# Patient Record
Sex: Female | Born: 1975 | Race: Black or African American | Hispanic: No | Marital: Single | Smoking: Never smoker
Health system: Southern US, Community
[De-identification: ages and names within clinical notes are randomized; demographics above are authoritative.]

---

## 2001-05-05 ENCOUNTER — Emergency Department (HOSPITAL_COMMUNITY): Admission: EM | Admit: 2001-05-05 | Discharge: 2001-05-05 | Payer: Self-pay | Admitting: Emergency Medicine

## 2009-06-13 ENCOUNTER — Inpatient Hospital Stay (HOSPITAL_COMMUNITY): Admission: AD | Admit: 2009-06-13 | Discharge: 2009-06-13 | Payer: Self-pay | Admitting: Obstetrics and Gynecology

## 2009-07-04 ENCOUNTER — Emergency Department (HOSPITAL_COMMUNITY): Admission: EM | Admit: 2009-07-04 | Discharge: 2009-07-04 | Payer: Self-pay | Admitting: Emergency Medicine

## 2010-07-20 ENCOUNTER — Emergency Department (HOSPITAL_COMMUNITY): Admission: EM | Admit: 2010-07-20 | Discharge: 2010-07-21 | Payer: Self-pay | Admitting: Emergency Medicine

## 2011-03-17 LAB — BUN: BUN: 2 mg/dL — ABNORMAL LOW (ref 6–23)

## 2011-03-17 LAB — CREATININE, SERUM
Creatinine, Ser: 0.54 mg/dL (ref 0.4–1.2)
GFR calc Af Amer: >60 mL/min (ref >60)
GFR calc non Af Amer: >60 mL/min (ref >60)

## 2011-03-17 LAB — AST: AST: 18 U/L (ref 0–37)

## 2011-03-17 LAB — CBC
HCT: 35.2 % — ABNORMAL LOW (ref 36.0–46.0)
Hemoglobin: 12.1 g/dL (ref 12.0–15.0)
MCHC: 34.5 g/dL (ref 30.0–36.0)
MCV: 96.3 fL (ref 78.0–100.0)
Platelets: 241 10*3/uL (ref 150–400)
RDW: 14.4 % (ref 11.5–15.5)

## 2011-07-16 ENCOUNTER — Inpatient Hospital Stay (INDEPENDENT_AMBULATORY_CARE_PROVIDER_SITE_OTHER)
Admission: RE | Admit: 2011-07-16 | Discharge: 2011-07-16 | Disposition: A | Discharge status: Home / Self Care | Payer: 59 | Source: Ambulatory Visit | Attending: Family Medicine | Admitting: Family Medicine

## 2011-07-16 DIAGNOSIS — N390 Urinary tract infection, site not specified: Secondary | ICD-10-CM

## 2011-07-16 LAB — POCT URINALYSIS DIP (DEVICE)
Bilirubin Urine: NEGATIVE
Hgb urine dipstick: NEGATIVE
Leukocytes, UA: NEGATIVE
Nitrite: POSITIVE — AB
Specific Gravity, Urine: >=1.03 (ref 1.005–1.030)
Urobilinogen, UA: 0.2 mg/dL (ref 0.0–1.0)

## 2011-07-18 LAB — URINE CULTURE: Culture  Setup Time: 201208072208

## 2011-08-29 ENCOUNTER — Emergency Department (HOSPITAL_COMMUNITY)
Admission: EM | Admit: 2011-08-29 | Discharge: 2011-08-29 | Disposition: A | Discharge status: Home / Self Care | Payer: 59 | Attending: Emergency Medicine | Admitting: Emergency Medicine

## 2011-08-29 DIAGNOSIS — R0602 Shortness of breath: Secondary | ICD-10-CM | POA: Insufficient documentation

## 2011-08-29 DIAGNOSIS — J45909 Unspecified asthma, uncomplicated: Secondary | ICD-10-CM | POA: Insufficient documentation

## 2011-08-29 DIAGNOSIS — R0609 Other forms of dyspnea: Secondary | ICD-10-CM | POA: Insufficient documentation

## 2011-08-29 DIAGNOSIS — R059 Cough, unspecified: Secondary | ICD-10-CM | POA: Insufficient documentation

## 2011-08-29 DIAGNOSIS — R0989 Other specified symptoms and signs involving the circulatory and respiratory systems: Secondary | ICD-10-CM | POA: Insufficient documentation

## 2011-08-29 DIAGNOSIS — R05 Cough: Secondary | ICD-10-CM | POA: Insufficient documentation

## 2012-03-24 ENCOUNTER — Encounter (HOSPITAL_COMMUNITY): Payer: Self-pay | Admitting: Emergency Medicine

## 2012-03-24 ENCOUNTER — Emergency Department (INDEPENDENT_AMBULATORY_CARE_PROVIDER_SITE_OTHER)
Admission: EM | Admit: 2012-03-24 | Discharge: 2012-03-24 | Disposition: A | Discharge status: Home / Self Care | Payer: 59 | Source: Home / Self Care

## 2012-03-24 DIAGNOSIS — J019 Acute sinusitis, unspecified: Secondary | ICD-10-CM

## 2012-03-24 DIAGNOSIS — J209 Acute bronchitis, unspecified: Secondary | ICD-10-CM

## 2012-03-24 MED ORDER — GUAIFENESIN-CODEINE 100-10 MG/5ML PO SYRP: ORAL_SOLUTION | ORAL | Status: AC

## 2012-03-24 MED ORDER — FLUCONAZOLE 150 MG PO TABS: 150.0000 mg | ORAL_TABLET | Freq: Once | ORAL | Status: AC

## 2012-03-24 MED ORDER — AMOXICILLIN-POT CLAVULANATE 875-125 MG PO TABS: 1.0000 | ORAL_TABLET | Freq: Two times a day (BID) | ORAL | Status: AC

## 2012-03-24 NOTE — ED Provider Notes (Signed)
History     CSN: [id number]  Arrival date & time 03/24/12  1943   None     Chief Complaint  Patient presents with  . Nasal Congestion    (Consider location/radiation/quality/duration/timing/severity/associated sxs/prior treatment) HPI Comments: Patient presents today with onset of nasal congestion, productive cough, sinus pressure and headache 3 days ago. She states her symptoms are progressively worsening. She does get mild improvement with Claritin and Nasonex. She has comfort in her left ear and a sore throat also. No fever or chills. Her mucous is yellow in color. Patient is also concerned because she is leaving to the [place] on vacation in 5 days. She has a history of asthma, but denies dyspnea or wheezing. She is a nonsmoker.   Past Medical History  Diagnosis Date  . Asthma     History reviewed. No pertinent past surgical history.  No family history on file.  History  Substance Use Topics  . Smoking status: Not on file  . Smokeless tobacco: Not on file  . Alcohol Use: 0.6 oz/week    1 Glasses of wine per week     occassional    OB History    Grav Para Term Preterm Abortions TAB SAB Ect Mult Living                  Review of Systems  Constitutional: Negative for fever, chills and fatigue.  HENT: Positive for ear pain, congestion, sore throat, rhinorrhea and sinus pressure. Negative for sneezing.   Respiratory: Positive for cough. Negative for shortness of breath and wheezing.   Cardiovascular: Negative for chest pain.  Neurological: Positive for headaches.    Allergies  Review of patient's allergies indicates no known allergies.  Home Medications   Current Outpatient Rx  Name Route Sig Dispense Refill  . AMOXICILLIN-POT CLAVULANATE 875-125 MG PO TABS Oral Take 1 tablet by mouth 2 (two) times daily after a meal. 20 tablet 0  . FLUCONAZOLE 150 MG PO TABS Oral Take 1 tablet (150 mg total) by mouth once. 1 tablet 0  . GUAIFENESIN-CODEINE 100-10 MG/5ML PO  SYRP  1-2 tsp every 6 hrs prn cough 120 mL 0    BP 119/83  Pulse 90  Temp(Src) 97.9 F (36.6 C) (Oral)  Resp 16  SpO2 97%  Physical Exam  Nursing note and vitals reviewed. Constitutional: She appears well-developed and well-nourished. No distress.  HENT:  Head: Normocephalic and atraumatic.  Right Ear: Tympanic membrane, external ear and ear canal normal.  Left Ear: Tympanic membrane, external ear and ear canal normal.  Nose: Mucosal edema present. Right sinus exhibits maxillary sinus tenderness and frontal sinus tenderness. Left sinus exhibits maxillary sinus tenderness and frontal sinus tenderness.  Mouth/Throat: Uvula is midline, oropharynx is clear and moist and mucous membranes are normal. No oropharyngeal exudate, posterior oropharyngeal edema or posterior oropharyngeal erythema.  Neck: Neck supple.  Cardiovascular: Normal rate, regular rhythm and normal heart sounds.   Pulmonary/Chest: Effort normal and breath sounds normal. No respiratory distress.  Lymphadenopathy:    She has no cervical adenopathy.  Neurological: She is alert.  Skin: Skin is warm and dry.  Psychiatric: She has a normal mood and affect.    ED Course  Procedures (including critical care time)  Labs Reviewed - No data to display No results found.   1. Acute sinusitis   2. Acute bronchitis       MDM          Melody Comas, [place] 03/24/12 2038

## 2012-03-24 NOTE — Discharge Instructions (Signed)
Increase fluids and rest. Tylenol or Ibuprofen as needed for discomfort. Do not take the prescription cough medication and drive or operate machinery.  Return if symptoms change or worsen.   Sinusitis Sinusitis an infection of the air pockets (sinuses) in your face. This can cause puffiness (swelling). It can also cause drainage from your sinuses.  HOME CARE   Only take medicine as told by your doctor.   Drink enough fluids to keep your pee (urine) clear or pale yellow.   Apply moist heat or ice packs for pain relief.   Use salt (saline) nose sprays. The spray will wet the thick fluid in the nose. This can help the sinuses drain.  GET HELP RIGHT AWAY IF:   You have a fever.   Your baby is older than 3 months with a rectal temperature of 102 F (38.9 C) or higher.   Your baby is 41 months old or younger with a rectal temperature of 100.4 F (38 C) or higher.   The pain gets worse.   You get a very bad headache.   You keep throwing up (vomiting).   Your face gets puffy.  MAKE SURE YOU:   Understand these instructions.   Will watch your condition.   Will get help right away if you are not doing well or get worse.  Document Released: 05/13/2008 Document Revised: 11/14/2011 Document Reviewed: 05/13/2008 [facility] Patient Information 2012 [place], [place].  Acute Bronchitis Bronchitis is when the organs and tissues involved in breathing get puffy (swollen) and can leak fluid. This makes it harder for air to get in and out of the lungs. You may cough a lot and produce thick spit (mucus). Acute means the illness started suddenly. HOME CARE  Rest.   Drink enough fluids to keep the pee (urine) clear or pale yellow.   Medicines may be given that will open up your airways to help you breathe better. Only take medicine as told by your doctor.   Use a cool mist vaporizer. This will help to thin any thick spit.   Do not smoke. Avoid secondhand smoke.  GET HELP RIGHT AWAY IF:    You have a temperature by mouth above 102 F (38.9 C), not controlled by medicine.   You have chills.   You develop severe shortness of breath or chest pain.   You have bloody spit mixed with mucus (sputum).   You throw up (vomit) often.   You lose too much body fluid (dehydrated).   You have a severe headache.   You feel faint.   You do not improve after 1 week of treatment.  MAKE SURE YOU:   Understand these instructions.   Will watch your condition.   Will get help right away if you are not doing well or get worse.  Document Released: 05/13/2008 Document Revised: 11/14/2011 Document Reviewed: 12/13/2009 [facility] Patient Information 2012 [place], [place].

## 2012-03-24 NOTE — ED Notes (Signed)
Pt. Stated, i started getting sick on Sat with congestion, ear pain, sore throat, and a lot of head pain.

## 2012-03-30 NOTE — ED Provider Notes (Signed)
Medical screening examination/treatment/procedure(s) were performed by non-physician practitioner and as supervising physician I was immediately available for consultation/collaboration.  Luiz Blare MD   Luiz Blare, MD 03/30/12 1420

## 2012-06-08 ENCOUNTER — Encounter (HOSPITAL_COMMUNITY): Payer: Self-pay

## 2012-06-08 ENCOUNTER — Emergency Department (HOSPITAL_COMMUNITY)
Admission: EM | Admit: 2012-06-08 | Discharge: 2012-06-08 | Disposition: A | Discharge status: Home / Self Care | Payer: 59 | Source: Home / Self Care | Attending: Family Medicine | Admitting: Family Medicine

## 2012-06-08 DIAGNOSIS — R229 Localized swelling, mass and lump, unspecified: Secondary | ICD-10-CM

## 2012-06-08 MED ORDER — IBUPROFEN 600 MG PO TABS: 600.0000 mg | ORAL_TABLET | Freq: Three times a day (TID) | ORAL | Status: AC | PRN

## 2012-06-08 NOTE — ED Notes (Signed)
States she woke this AM with a lump on her left mid tibial area; denies trauma; has used ice; concerned for poss "clot" in her leg

## 2012-06-08 NOTE — ED Provider Notes (Signed)
History     CSN: [id number]  Arrival date & time 06/08/12  1110   First MD Initiated Contact with Patient 06/08/12 1123      Chief Complaint  Patient presents with  . Skin Problem    (Consider location/radiation/quality/duration/timing/severity/associated sxs/prior treatment) HPI Comments: 36 year old female nondiabetic. No significant past medical history. Here complaining of a focal swelling mildly tender area in the left anterior mid lower extremity. Noticed first some early this morning. Pain does not increase with weightbearing or walking. Only tender to touch. No cough tenderness. Denies any known injury or recent falls. Denies general malaise, fever, chills, cough, congestion, headache, or any other symptom.   Past Medical History  Diagnosis Date  . Asthma     History reviewed. No pertinent past surgical history.  History reviewed. No pertinent family history.  History  Substance Use Topics  . Smoking status: Not on file  . Smokeless tobacco: Not on file  . Alcohol Use: 0.6 oz/week    1 Glasses of wine per week     occassional    OB History    Grav Para Term Preterm Abortions TAB SAB Ect Mult Living                  Review of Systems  Constitutional: Negative for fever, chills, appetite change and fatigue.       10 systems reviewed and  pertinent negative and positive symptoms are as per HPI.     All other systems reviewed and are negative.    Allergies  Review of patient's allergies indicates no known allergies.  Home Medications   Current Outpatient Rx  Name Route Sig Dispense Refill  . DESLORATADINE 5 MG PO TABS Oral Take 5 mg by mouth daily.    . IBUPROFEN 600 MG PO TABS Oral Take 1 tablet (600 mg total) by mouth every 8 (eight) hours as needed for pain. 20 tablet 0    BP 96/67  Pulse 74  Temp 98.6 F (37 C) (Oral)  Resp 18  SpO2 100%  LMP 05/21/2012  Physical Exam  Nursing note and vitals reviewed. Constitutional: She is oriented to  person, place, and time. She appears well-developed and well-nourished. No distress.  HENT:  Head: Normocephalic and atraumatic.  Mouth/Throat: Oropharynx is clear and moist. No oropharyngeal exudate.  Eyes: Conjunctivae are normal. Pupils are equal, round, and reactive to light. No scleral icterus.  Neck: Neck supple. No thyromegaly present.  Cardiovascular: Normal heart sounds.   Pulmonary/Chest: Breath sounds normal.  Abdominal: Soft. There is no tenderness.       No hepatosplenomegally    Lymphadenopathy:    She has no cervical adenopathy.  Neurological: She is alert and oriented to person, place, and time.  Skin:       Left lower leg: diffusse area or minimal tenderness and induration antero medial to mid tibial bone. No nodular. No crepitus. No fluctuation, erythema, bruising, discoloration or hyperpigmentation. No associated skin brakes, pustules, abrassion or laceration. Normal temperature compared with right side.  Intact sensation and distal pedal pulses are normal.     ED Course  Procedures (including critical care time)  Labs Reviewed - No data to display No results found.   1. Localized skin mass, lump, or swelling       MDM  Impress focal contusion of the left medial tibia. Likely from unknown trauma. Recommended to use ice pad and take ibuprofen. Asked to return to medical attention if worsening or new symptoms.  Sharin Grave, MD 06/09/12 [phone]

## 2012-06-08 NOTE — Discharge Instructions (Signed)
Take the prescribed medication as instructed and recommended to take ibuprofen with food as it can upset your stomach. Use ice pad as much as possible today or at least 15 minutes 3 times a day. Return to medical attention if new symptoms like general malaise fever or worsening spreading rash, swelling, drainge or increased pain. Otherwise followup with your primary care provider if persistent symptoms after 1 week.

## 2013-12-29 ENCOUNTER — Emergency Department (HOSPITAL_COMMUNITY)
Admission: EM | Admit: 2013-12-29 | Discharge: 2013-12-30 | Disposition: A | Discharge status: Home / Self Care | Payer: 59 | Attending: Emergency Medicine | Admitting: Emergency Medicine

## 2013-12-29 DIAGNOSIS — Z3202 Encounter for pregnancy test, result negative: Secondary | ICD-10-CM | POA: Insufficient documentation

## 2013-12-29 DIAGNOSIS — R091 Pleurisy: Secondary | ICD-10-CM

## 2013-12-29 DIAGNOSIS — R071 Chest pain on breathing: Secondary | ICD-10-CM | POA: Insufficient documentation

## 2013-12-29 DIAGNOSIS — N390 Urinary tract infection, site not specified: Secondary | ICD-10-CM

## 2013-12-29 DIAGNOSIS — J45909 Unspecified asthma, uncomplicated: Secondary | ICD-10-CM | POA: Insufficient documentation

## 2013-12-30 ENCOUNTER — Emergency Department (HOSPITAL_COMMUNITY): Payer: 59

## 2013-12-30 ENCOUNTER — Encounter (HOSPITAL_COMMUNITY): Payer: Self-pay | Admitting: Emergency Medicine

## 2013-12-30 LAB — BASIC METABOLIC PANEL
BUN: 5 mg/dL — ABNORMAL LOW (ref 6–23)
CALCIUM: 8.6 mg/dL (ref 8.4–10.5)
CHLORIDE: 104 meq/L (ref 96–112)
CO2: 22 meq/L (ref 19–32)
Creatinine, Ser: 0.64 mg/dL (ref 0.50–1.10)
GFR calc non Af Amer: >90 mL/min (ref >90)
GLUCOSE: 92 mg/dL (ref 70–99)
POTASSIUM: 3.6 meq/L — AB (ref 3.7–5.3)
SODIUM: 139 meq/L (ref 137–147)

## 2013-12-30 LAB — URINALYSIS, ROUTINE W REFLEX MICROSCOPIC
Bilirubin Urine: NEGATIVE
Glucose, UA: NEGATIVE mg/dL
Hgb urine dipstick: NEGATIVE
Ketones, ur: NEGATIVE mg/dL
NITRITE: POSITIVE — AB
PH: 5.5 (ref 5.0–8.0)
Protein, ur: NEGATIVE mg/dL
Specific Gravity, Urine: 1.021 (ref 1.005–1.030)
Urobilinogen, UA: 0.2 mg/dL (ref 0.0–1.0)

## 2013-12-30 LAB — CBC
HEMATOCRIT: 35.4 % — AB (ref 36.0–46.0)
HEMOGLOBIN: 12.5 g/dL (ref 12.0–15.0)
MCH: 31.8 pg (ref 26.0–34.0)
MCHC: 35.3 g/dL (ref 30.0–36.0)
MCV: 90.1 fL (ref 78.0–100.0)
PLATELETS: 259 10*3/uL (ref 150–400)
RBC: 3.93 MIL/uL (ref 3.87–5.11)
RDW: 13.6 % (ref 11.5–15.5)
WBC: 4.8 10*3/uL (ref 4.0–10.5)

## 2013-12-30 LAB — URINE MICROSCOPIC-ADD ON

## 2013-12-30 LAB — POCT I-STAT TROPONIN I: Troponin i, poc: 0 ng/mL (ref 0.00–0.08)

## 2013-12-30 LAB — POCT PREGNANCY, URINE: Preg Test, Ur: NEGATIVE

## 2013-12-30 LAB — D-DIMER, QUANTITATIVE: D-Dimer, Quant: <0.27 ug/mL-FEU (ref 0.00–0.48)

## 2013-12-30 MED ORDER — IBUPROFEN 600 MG PO TABS: 600.0000 mg | ORAL_TABLET | Freq: Four times a day (QID) | ORAL | Status: DC | PRN

## 2013-12-30 MED ORDER — KETOROLAC TROMETHAMINE 30 MG/ML IJ SOLN
30.0000 mg | Freq: Once | INTRAMUSCULAR | Status: AC
  Administered 2013-12-30: 30 mg via INTRAVENOUS
  Filled 2013-12-30: qty 1

## 2013-12-30 MED ORDER — NITROFURANTOIN MONOHYD MACRO 100 MG PO CAPS: 100.0000 mg | ORAL_CAPSULE | Freq: Two times a day (BID) | ORAL | Status: DC

## 2013-12-30 NOTE — Discharge Instructions (Signed)
Pleurisy Pleurisy is an inflammation and swelling of the lining of the lungs (pleura). Because of this inflammation, it hurts to breathe. It can be aggravated by coughing, laughing, or deep breathing. Pleurisy is often caused by an underlying infection or disease.  HOME CARE INSTRUCTIONS  Monitor your pleurisy for any changes. The following actions may help to alleviate any discomfort you are experiencing:  Medicine may help with pain. Only take over-the-counter or prescription medicines for pain, discomfort, or fever as directed by your health care provider.  Only take antibiotic medicine as directed. Make sure to finish it even if you start to feel better. SEEK MEDICAL CARE IF:   Your pain is not controlled with medicine or is increasing.  You have an increase in pus-like (purulent) secretions brought up with coughing. SEEK IMMEDIATE MEDICAL CARE IF:   You have blue or dark lips, fingernails, or toenails.  You are coughing up blood.  You have increased difficulty breathing.  You have continuing pain unrelieved by medicine or pain lasting more than 1 week.  You have pain that radiates into your neck, arms, or jaw.  You develop increased shortness of breath or wheezing.  You develop a fever, rash, vomiting, fainting, or other serious symptoms. MAKE SURE YOU:  Understand these instructions.   Will watch your condition.   Will get help right away if you are not doing well or get worse.  Document Released: 11/25/2005 Document Revised: 07/28/2013 Document Reviewed: 05/09/2013 [facility] Patient Information 2014 [place].  Urinary Tract Infection Urinary tract infections (UTIs) can develop anywhere along your urinary tract. Your urinary tract is your body's drainage system for removing wastes and extra water. Your urinary tract includes two kidneys, two ureters, a bladder, and a urethra. Your kidneys are a pair of bean-shaped organs. Each kidney is about the size of your  fist. They are located below your ribs, one on each side of your spine. CAUSES Infections are caused by microbes, which are microscopic organisms, including fungi, viruses, and bacteria. These organisms are so small that they can only be seen through a microscope. Bacteria are the microbes that most commonly cause UTIs. SYMPTOMS  Symptoms of UTIs may vary by age and gender of the patient and by the location of the infection. Symptoms in young women typically include a frequent and intense urge to urinate and a painful, burning feeling in the bladder or urethra during urination. Older women and men are more likely to be tired, shaky, and weak and have muscle aches and abdominal pain. A fever may mean the infection is in your kidneys. Other symptoms of a kidney infection include pain in your back or sides below the ribs, nausea, and vomiting. DIAGNOSIS To diagnose a UTI, your caregiver will ask you about your symptoms. Your caregiver also will ask to provide a urine sample. The urine sample will be tested for bacteria and white blood cells. White blood cells are made by your body to help fight infection. TREATMENT  Typically, UTIs can be treated with medication. Because most UTIs are caused by a bacterial infection, they usually can be treated with the use of antibiotics. The choice of antibiotic and length of treatment depend on your symptoms and the type of bacteria causing your infection. HOME CARE INSTRUCTIONS  If you were prescribed antibiotics, take them exactly as your caregiver instructs you. Finish the medication even if you feel better after you have only taken some of the medication.  Drink enough water and fluids to  keep your urine clear or pale yellow.  Avoid caffeine, tea, and carbonated beverages. They tend to irritate your bladder.  Empty your bladder often. Avoid holding urine for long periods of time.  Empty your bladder before and after sexual intercourse.  After a bowel  movement, women should cleanse from front to back. Use each tissue only once. SEEK MEDICAL CARE IF:   You have back pain.  You develop a fever.  Your symptoms do not begin to resolve within 3 days. SEEK IMMEDIATE MEDICAL CARE IF:   You have severe back pain or lower abdominal pain.  You develop chills.  You have nausea or vomiting.  You have continued burning or discomfort with urination. MAKE SURE YOU:   Understand these instructions.  Will watch your condition.  Will get help right away if you are not doing well or get worse. Document Released: 09/04/2005 Document Revised: 05/26/2012 Document Reviewed: 01/03/2012 [facility] Patient Information 2014 [place].

## 2013-12-30 NOTE — ED Notes (Signed)
Pt states that she began to have back pain yesterday and that it is now radiating through to left chest under left breast; pt states that the pain is sharp and worse upon taking a deep breathe; pt denies shortness of breath or nausea; pt states "the pain feels like it is on the inside"

## 2013-12-30 NOTE — ED Provider Notes (Signed)
CSN: [id number]     Arrival date & time 12/29/13  2352 History   First MD Initiated Contact with Patient 12/30/13 0102     Chief Complaint  Patient presents with  . Chest Pain   (Consider location/radiation/quality/duration/timing/severity/associated sxs/prior Treatment) HPI Patient states she woke 2 days ago with left thoracic back pain. The pain was worse with movement and deep breathing. She denies any trauma or heavy lifting. She's had no cough, fever or chills. The pain began to radiate around to the left chest area under her breasts. The pain is sharp and continues to be worse with deep breathing. She's had no lower extremity pain or swelling. She has had no recent extended travel or surgeries. She is not on oral birth control. She states her mother did have a DVT in conjunction with pregnancy requiring anticoagulants. Past Medical History  Diagnosis Date  . Asthma    History reviewed. No pertinent past surgical history. No family history on file. History  Substance Use Topics  . Smoking status: Never Smoker   . Smokeless tobacco: Not on file  . Alcohol Use: 0.6 oz/week    1 Glasses of wine per week     Comment: occassional   OB History   Grav Para Term Preterm Abortions TAB SAB Ect Mult Living                 Review of Systems  Constitutional: Negative for fever and chills.  Respiratory: Negative for cough and shortness of breath.   Cardiovascular: Positive for chest pain. Negative for palpitations and leg swelling.  Gastrointestinal: Negative for nausea, vomiting, abdominal pain, diarrhea, constipation and abdominal distention.  Genitourinary: Negative for dysuria, frequency and flank pain.  Musculoskeletal: Negative for back pain, neck pain and neck stiffness.  Skin: Negative for rash and wound.  Neurological: Negative for dizziness, speech difficulty, weakness, light-headedness, numbness and headaches.  All other systems reviewed and are negative.    Allergies   Review of patient's allergies indicates no known allergies.  Home Medications   Current Outpatient Rx  Name  Route  Sig  Dispense  Refill  . Cranberry-Vitamin C-Probiotic (AZO CRANBERRY PO)   Oral   Take 2 tablets by mouth once.          BP 122/82  Pulse 85  Temp(Src) 97.9 F (36.6 C) (Oral)  Resp 20  SpO2 98%  LMP 12/20/2013 Physical Exam  Nursing note and vitals reviewed. Constitutional: She is oriented to person, place, and time. She appears well-developed and well-nourished. No distress.  HENT:  Head: Normocephalic and atraumatic.  Mouth/Throat: Oropharynx is clear and moist.  Eyes: EOM are normal. Pupils are equal, round, and reactive to light.  Neck: Normal range of motion. Neck supple.  Cardiovascular: Normal rate and regular rhythm.   Pulmonary/Chest: Effort normal and breath sounds normal. No respiratory distress. She has no wheezes. She has no rales. She exhibits no tenderness.  Abdominal: Soft. Bowel sounds are normal. She exhibits no distension and no mass. There is no tenderness. There is no rebound and no guarding.  Musculoskeletal: Normal range of motion. She exhibits no edema and no tenderness.  No thoracic tenderness to palpation. No calf swelling or tenderness.  Neurological: She is alert and oriented to person, place, and time.  Patient is alert and oriented x3 with clear, goal oriented speech. Patient has 5/5 motor in all extremities. Sensation is intact to light touch.   Skin: Skin is warm and dry. No rash noted. No  erythema.  Psychiatric: She has a normal mood and affect. Her behavior is normal.    ED Course  Procedures (including critical care time) Labs Review Labs Reviewed  CBC - Abnormal; Notable for the following:    HCT 35.4 (*)    All other components within normal limits  BASIC METABOLIC PANEL - Abnormal; Notable for the following:    Potassium 3.6 (*)    BUN 5 (*)    All other components within normal limits  URINALYSIS, ROUTINE W  REFLEX MICROSCOPIC - Abnormal; Notable for the following:    Color, Urine AMBER (*)    APPearance CLOUDY (*)    Nitrite POSITIVE (*)    Leukocytes, UA SMALL (*)    All other components within normal limits  URINE MICROSCOPIC-ADD ON - Abnormal; Notable for the following:    Squamous Epithelial / LPF MANY (*)    Bacteria, UA MANY (*)    All other components within normal limits  URINE CULTURE  D-DIMER, QUANTITATIVE  POCT I-STAT TROPONIN I  POCT PREGNANCY, URINE   Imaging Review Dg Chest Port 1 View  12/30/2013   CLINICAL DATA:  Chest pain  EXAM: PORTABLE CHEST - 1 VIEW  COMPARISON:  None.  FINDINGS: Numerous leads and wires project over the chest. Patient rotated left. Midline trachea. Normal heart size and mediastinal contours. No pleural effusion or pneumothorax. Clear lungs.  IMPRESSION: No acute cardiopulmonary disease.   Electronically Signed   By: Abigail Miyamoto M.D.   On: 12/30/2013 01:04    EKG Interpretation    Date/Time:  Thursday December 30 2013 00:01:31 EST Ventricular Rate:  79 PR Interval:  145 QRS Duration: 89 QT Interval:  394 QTC Calculation: 452 R Axis:   84 Text Interpretation:  Sinus rhythm Borderline T abnormalities, anterior leads Baseline wander in lead(s) V4 Confirmed by Lita Mains  MD, Shian Goodnow ([id number]) on 12/30/2013 2:33:03 AM            MDM    Patient says she's feeling much better after Toradol. Her d-dimer is normal. Vital signs remained stable in the emergency department. We'll treat her urinary tract infection. Patient likely has pleurisy as cause for her chest pain. Doubt coronary artery disease given negative troponin and EKG and no risk factors. Return precautions have been given and patient voiced understanding.  Julianne Rice, MD 12/30/13 [phone]

## 2014-01-01 LAB — URINE CULTURE: Colony Count: >=100000

## 2014-01-02 ENCOUNTER — Telehealth (HOSPITAL_COMMUNITY): Payer: Self-pay | Admitting: Emergency Medicine

## 2014-01-02 NOTE — ED Notes (Signed)
Post ED Visit - Positive Culture Follow-up  Culture report reviewed by antimicrobial stewardship pharmacist: []  Wes Oxford, Pharm.D., BCPS [x]  Heide Guile, Pharm.D., BCPS []  Alycia Rossetti, Pharm.D., BCPS []  [place], Pharm.D., BCPS, AAHIVP []  Legrand Como, Pharm.D., BCPS, AAHIVP  Positive urine culture Treated with Macrobid, organism sensitive to the same and no further patient follow-up is required at this time.  Myrna Blazer 01/02/2014, 11:33 AM

## 2015-01-16 ENCOUNTER — Emergency Department (HOSPITAL_COMMUNITY)
Admission: EM | Admit: 2015-01-16 | Discharge: 2015-01-16 | Disposition: A | Discharge status: Home / Self Care | Payer: Self-pay | Source: Home / Self Care

## 2015-02-08 ENCOUNTER — Encounter: Payer: 59 | Attending: Obstetrics and Gynecology | Admitting: Dietician

## 2015-02-08 VITALS — Ht 62.0 in | Wt 150.2 lb

## 2015-02-08 DIAGNOSIS — R7309 Other abnormal glucose: Secondary | ICD-10-CM | POA: Insufficient documentation

## 2015-02-08 DIAGNOSIS — R7303 Prediabetes: Secondary | ICD-10-CM

## 2015-02-08 DIAGNOSIS — Z713 Dietary counseling and surveillance: Secondary | ICD-10-CM | POA: Diagnosis not present

## 2015-02-08 NOTE — Patient Instructions (Addendum)
-  Add a protein food to each meal and snack  -Boiled egg, nuts, yogurt, [place] bacon/sausage, chicken, cheese   -Work on scaling back on oil when cooking  -Continue exercise routine  -Continue to watch overall portions  -Avoid peanut butter that has "partially hydrogenated oil" in the ingredients (the fewer ingredients, the better)

## 2015-02-08 NOTE — Progress Notes (Signed)
  Medical Nutrition Therapy:  Appt start time: 0810 end time:  0905.   Assessment:  Primary concerns today: Stephanie Ayers is here today referred for prediabetes. She is motivated to prevent the onset of diabetes. HgbA1c 5.8%. 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 Recently bought an elliptical and tries to use it 10-20 minutes per day. Also bought a fitbit, tracks foods and exercise. 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 She lives with her 39 year old daughter. They both do the food shopping and cooking. They eat out about 4 days a week.   She considers herself a picky eater. Doesn't really like vegeatbels besides greens, salad, celery, and cucumbers. However, [name] states she is open to trying new foods. Recently switched from using sugar to sugar substitute. Has been trying to limit portions and drink more water. Tries not to eat after 8pm.   Preferred Learning Style:   No preference indicated   Learning Readiness:   Ready   MEDICATIONS: vitamin D   DIETARY INTAKE:  Avoided foods include most vegetables, eggs, and seafood.    24-hr recall:  B (6-9 AM): 1 waffle with lite syrup and grapefruit  Snk ( AM): sometimes fruit  L ( PM): salad with 2 Tbsp olive garden lite dressing or peanut butter and jelly sandwich Snk ( PM): sometimes fruit or fruit snacks D ( PM): Chickfila kids meal nuggets with diet lemonade Snk ( PM):   Beverages: 40 oz water per day, 0.5-1 cup Trop50 orange juice per day, occasionally 100% apple juice  Usual physical activity: 10-20 minutes on the elliptical per day  Estimated energy needs: 1600-1800 calories 180-200 g carbohydrates 120-135 g protein 44-50 g fat  Progress Towards Goal(s):  In progress.   Nutritional Diagnosis:  [place]-2.2 Altered nutrition-related laboratory As related to history of excessive carbohydrate intake.  As evidenced by HgbA1c 5.8%.    Intervention:  Nutrition counseling  provided. Explained HgbA1c and effects of macronutrients and exercise on blood sugars. Goals: -Add a protein food to each meal and snack  -Boiled egg, nuts, yogurt, [place] bacon/sausage, chicken, cheese  -Work on scaling back on oil when cooking -Continue exercise routine -Continue to watch overall portions -Avoid peanut butter that has "partially hydrogenated oil" in the ingredients (the fewer ingredients, the better)  Teaching Method Utilized:  Visual Auditory  Handouts given during visit include:  MyPlate  Snack sheet  Meal planning card  Barriers to learning/adherence to lifestyle change: none  Demonstrated degree of understanding via:  Teach Back   Monitoring/Evaluation:  Dietary intake, exercise, labs, and body weight in 2 month(s).

## 2015-03-29 ENCOUNTER — Ambulatory Visit: Payer: 59 | Admitting: Dietician

## 2017-01-27 DIAGNOSIS — J4520 Mild intermittent asthma, uncomplicated: Secondary | ICD-10-CM | POA: Diagnosis not present

## 2017-01-27 DIAGNOSIS — M3500 Sicca syndrome, unspecified: Secondary | ICD-10-CM | POA: Diagnosis not present

## 2017-01-27 DIAGNOSIS — G43009 Migraine without aura, not intractable, without status migrainosus: Secondary | ICD-10-CM | POA: Diagnosis not present

## 2017-03-14 DIAGNOSIS — M3500 Sicca syndrome, unspecified: Secondary | ICD-10-CM | POA: Diagnosis not present

## 2017-03-27 DIAGNOSIS — G43009 Migraine without aura, not intractable, without status migrainosus: Secondary | ICD-10-CM | POA: Diagnosis not present

## 2017-03-27 DIAGNOSIS — J4520 Mild intermittent asthma, uncomplicated: Secondary | ICD-10-CM | POA: Diagnosis not present

## 2017-03-27 DIAGNOSIS — M3500 Sicca syndrome, unspecified: Secondary | ICD-10-CM | POA: Diagnosis not present

## 2017-09-29 DIAGNOSIS — J4520 Mild intermittent asthma, uncomplicated: Secondary | ICD-10-CM | POA: Diagnosis not present

## 2017-09-29 DIAGNOSIS — Z23 Encounter for immunization: Secondary | ICD-10-CM | POA: Diagnosis not present

## 2017-09-29 DIAGNOSIS — Z13220 Encounter for screening for lipoid disorders: Secondary | ICD-10-CM | POA: Diagnosis not present

## 2017-09-29 DIAGNOSIS — M3500 Sicca syndrome, unspecified: Secondary | ICD-10-CM | POA: Diagnosis not present

## 2017-09-29 DIAGNOSIS — G43009 Migraine without aura, not intractable, without status migrainosus: Secondary | ICD-10-CM | POA: Diagnosis not present

## 2017-09-29 DIAGNOSIS — R030 Elevated blood-pressure reading, without diagnosis of hypertension: Secondary | ICD-10-CM | POA: Diagnosis not present

## 2017-11-25 DIAGNOSIS — J4520 Mild intermittent asthma, uncomplicated: Secondary | ICD-10-CM | POA: Diagnosis not present

## 2017-11-25 DIAGNOSIS — M3500 Sicca syndrome, unspecified: Secondary | ICD-10-CM | POA: Diagnosis not present

## 2017-11-25 DIAGNOSIS — G43009 Migraine without aura, not intractable, without status migrainosus: Secondary | ICD-10-CM | POA: Diagnosis not present

## 2017-11-27 ENCOUNTER — Encounter (HOSPITAL_COMMUNITY): Payer: Self-pay | Admitting: Emergency Medicine

## 2017-11-27 ENCOUNTER — Other Ambulatory Visit: Payer: Self-pay

## 2017-11-27 ENCOUNTER — Ambulatory Visit (HOSPITAL_COMMUNITY)
Admission: EM | Admit: 2017-11-27 | Discharge: 2017-11-27 | Disposition: A | Discharge status: Home / Self Care | Payer: 59 | Attending: Family Medicine | Admitting: Family Medicine

## 2017-11-27 DIAGNOSIS — M25512 Pain in left shoulder: Secondary | ICD-10-CM | POA: Diagnosis not present

## 2017-11-27 NOTE — ED Triage Notes (Signed)
Pt c/o L shoulder pain since yesterday, maybe have tried to dislodge a door with her shoulder which caused the pain. No obvious bruising or deformityl

## 2017-11-27 NOTE — ED Provider Notes (Signed)
[place]    CSN: [id number] Arrival date & time: 11/27/17  1256     History   Chief Complaint Chief Complaint  Patient presents with  . Shoulder Pain    HPI Stephanie Ayers is a 41 y.o. female presenting with acute left shoulder pain for 1 day. She states she was trying to open a door with a broken knob and had to use more force than normal. Started to feel soreness later that evening. No numbness or tingling. Increased pain with movement today. Mild discomfort with neck rotation. No difficulty breathing, no shortness of breath, no chest pain.   HPI  Past Medical History:  Diagnosis Date  . Asthma     There are no active problems to display for this patient.   History reviewed. No pertinent surgical history.  OB History    No data available       Home Medications    Prior to Admission medications   Medication Sig Start Date End Date Taking? Authorizing Provider  cholecalciferol (VITAMIN D) 1000 UNITS tablet Take 1,000 Units by mouth daily.    [provider]  Cranberry-Vitamin C-Probiotic (AZO CRANBERRY PO) Take 2 tablets by mouth once.    [provider]  ibuprofen (ADVIL,MOTRIN) 600 MG tablet Take 1 tablet (600 mg total) by mouth every 6 (six) hours as needed. 12/30/13   Julianne Rice, MD  nitrofurantoin, macrocrystal-monohydrate, (MACROBID) 100 MG capsule Take 1 capsule (100 mg total) by mouth 2 (two) times daily. 12/30/13   Julianne Rice, MD    Family History No family history on file.  Social History Social History   Tobacco Use  . Smoking status: Never Smoker  Substance Use Topics  . Alcohol use: Yes    Alcohol/week: 0.6 oz    Types: 1 Glasses of wine per week    Comment: occassional  . Drug use: No     Allergies   Shellfish allergy   Review of Systems Review of Systems  Musculoskeletal: Positive for arthralgias and neck pain. Negative for joint swelling and neck stiffness.  All other systems reviewed  and are negative.    Physical Exam Triage Vital Signs ED Triage Vitals [11/27/17 1313]  Enc Vitals Group     BP (!) 130/94     Pulse Rate 95     Resp 14     Temp 98.1 F (36.7 C)     Temp src      SpO2 98 %     Weight      Height      Head Circumference      Peak Flow      Pain Score      Pain Loc      Pain Edu?      Excl. in [place]?    No data found.  Updated Vital Signs BP (!) 130/94   Pulse 95   Temp 98.1 F (36.7 C)   Resp 14   SpO2 98%    Physical Exam  Constitutional: She is oriented to person, place, and time. She appears well-developed and well-nourished. No distress.  HENT:  Head: Normocephalic and atraumatic.  Neck: Normal range of motion. Neck supple.  Cardiovascular: Normal rate and regular rhythm.  No murmur heard. Pulmonary/Chest: Effort normal and breath sounds normal. No respiratory distress. She has no wheezes.  Musculoskeletal: She exhibits tenderness.  No swelling or erythema to shoulder. Mild tenderness to palpation along scapular spine and neck musculature.   Full active  ROM with forward flexion, internal rotation, adduction, abduction, external rotation. Painful with internal and external rotation.   Negative Hawkings, Negative neers, Negative AC crossover  Radial pulse 2+, cap refill < 2   Neurological: She is alert and oriented to person, place, and time.     UC Treatments / Results  Labs (all labs ordered are listed, but only abnormal results are displayed) Labs Reviewed - No data to display  EKG  EKG Interpretation None       Radiology No results found.  Procedures Procedures (including critical care time)  Medications Ordered in UC Medications - No data to display   Initial Impression / Assessment and Plan / UC Course  I have reviewed the triage vital signs and the nursing notes.  Pertinent labs & imaging results that were available during my care of the patient were reviewed by me and considered in my medical  decision making (see chart for details).     Likely Muscular strain from door incident. Advised anti-inflammatories with ice/heat. Avoid aggravating motions but continue to use. X ray imaging deferred as fracture/dislocations seem unlikely. Follow up in 2 weeks with orthopedics if symptoms not improving.   Final Clinical Impressions(s) / UC Diagnoses   Final diagnoses:  None    ED Discharge Orders    None       Controlled Substance Prescriptions [place] Controlled Substance Registry consulted? Not Applicable   Janith Lima, [place] 11/27/17 1345

## 2017-11-27 NOTE — Discharge Instructions (Signed)
Pain is most likely a muscular strain. Pain may take 1-2 weeks to resolve.  Use anti-inflammatories for pain/swelling. You may take up to 800 mg Ibuprofen every 8 hours with food. You may supplement Ibuprofen with Tylenol [phone] mg every 8 hours.   Please alternate using ice/heating pad to help with any discomfort.   Please follow up here or with your primary care if symptoms not improving in 2 weeks. Please return sooner if pain changes, worsens, develop numbness, tingling.   Please follow up in 2 weeks with orthopedics if symptoms persist and are not improving.

## 2017-12-05 DIAGNOSIS — Z1231 Encounter for screening mammogram for malignant neoplasm of breast: Secondary | ICD-10-CM | POA: Diagnosis not present

## 2017-12-17 DIAGNOSIS — R922 Inconclusive mammogram: Secondary | ICD-10-CM | POA: Diagnosis not present

## 2017-12-17 DIAGNOSIS — R921 Mammographic calcification found on diagnostic imaging of breast: Secondary | ICD-10-CM | POA: Diagnosis not present

## 2017-12-23 DIAGNOSIS — Z01419 Encounter for gynecological examination (general) (routine) without abnormal findings: Secondary | ICD-10-CM | POA: Diagnosis not present

## 2017-12-24 ENCOUNTER — Other Ambulatory Visit: Payer: Self-pay | Admitting: Radiology

## 2017-12-24 DIAGNOSIS — N62 Hypertrophy of breast: Secondary | ICD-10-CM | POA: Diagnosis not present

## 2017-12-24 DIAGNOSIS — R921 Mammographic calcification found on diagnostic imaging of breast: Secondary | ICD-10-CM | POA: Diagnosis not present

## 2017-12-24 DIAGNOSIS — N6091 Unspecified benign mammary dysplasia of right breast: Secondary | ICD-10-CM | POA: Diagnosis not present

## 2018-01-21 DIAGNOSIS — R87612 Low grade squamous intraepithelial lesion on cytologic smear of cervix (LGSIL): Secondary | ICD-10-CM | POA: Diagnosis not present

## 2018-04-06 DIAGNOSIS — Z0000 Encounter for general adult medical examination without abnormal findings: Secondary | ICD-10-CM | POA: Diagnosis not present

## 2018-04-06 DIAGNOSIS — Z79899 Other long term (current) drug therapy: Secondary | ICD-10-CM | POA: Diagnosis not present

## 2018-04-23 DIAGNOSIS — R921 Mammographic calcification found on diagnostic imaging of breast: Secondary | ICD-10-CM | POA: Diagnosis not present

## 2018-09-17 DIAGNOSIS — J4520 Mild intermittent asthma, uncomplicated: Secondary | ICD-10-CM | POA: Diagnosis not present

## 2018-09-17 DIAGNOSIS — M3500 Sicca syndrome, unspecified: Secondary | ICD-10-CM | POA: Diagnosis not present

## 2018-09-17 DIAGNOSIS — G43009 Migraine without aura, not intractable, without status migrainosus: Secondary | ICD-10-CM | POA: Diagnosis not present

## 2018-12-24 DIAGNOSIS — R921 Mammographic calcification found on diagnostic imaging of breast: Secondary | ICD-10-CM | POA: Diagnosis not present

## 2019-01-07 DIAGNOSIS — Z01419 Encounter for gynecological examination (general) (routine) without abnormal findings: Secondary | ICD-10-CM | POA: Diagnosis not present

## 2019-01-07 DIAGNOSIS — Z1329 Encounter for screening for other suspected endocrine disorder: Secondary | ICD-10-CM | POA: Diagnosis not present

## 2019-01-07 DIAGNOSIS — Z0000 Encounter for general adult medical examination without abnormal findings: Secondary | ICD-10-CM | POA: Diagnosis not present

## 2019-01-07 DIAGNOSIS — Z6828 Body mass index (BMI) 28.0-28.9, adult: Secondary | ICD-10-CM | POA: Diagnosis not present

## 2019-01-07 DIAGNOSIS — R87612 Low grade squamous intraepithelial lesion on cytologic smear of cervix (LGSIL): Secondary | ICD-10-CM | POA: Diagnosis not present

## 2019-01-07 DIAGNOSIS — Z13220 Encounter for screening for lipoid disorders: Secondary | ICD-10-CM | POA: Diagnosis not present

## 2019-01-07 DIAGNOSIS — Z130 Encounter for screening for diseases of the blood and blood-forming organs and certain disorders involving the immune mechanism: Secondary | ICD-10-CM | POA: Diagnosis not present

## 2019-01-07 DIAGNOSIS — R030 Elevated blood-pressure reading, without diagnosis of hypertension: Secondary | ICD-10-CM | POA: Diagnosis not present

## 2019-01-07 DIAGNOSIS — Z131 Encounter for screening for diabetes mellitus: Secondary | ICD-10-CM | POA: Diagnosis not present

## 2019-01-26 DIAGNOSIS — R1900 Intra-abdominal and pelvic swelling, mass and lump, unspecified site: Secondary | ICD-10-CM | POA: Diagnosis not present

## 2019-06-29 ENCOUNTER — Other Ambulatory Visit: Payer: Self-pay | Admitting: *Deleted

## 2019-06-29 DIAGNOSIS — Z20822 Contact with and (suspected) exposure to covid-19: Secondary | ICD-10-CM

## 2019-07-04 LAB — NOVEL CORONAVIRUS, NAA: SARS-CoV-2, NAA: NOT DETECTED

## 2020-04-13 ENCOUNTER — Other Ambulatory Visit: Payer: Self-pay | Admitting: Obstetrics and Gynecology

## 2020-04-13 ENCOUNTER — Ambulatory Visit
Admission: RE | Admit: 2020-04-13 | Discharge: 2020-04-13 | Disposition: A | Discharge status: Home / Self Care | Payer: 59 | Source: Ambulatory Visit | Attending: Obstetrics and Gynecology | Admitting: Obstetrics and Gynecology

## 2020-04-13 DIAGNOSIS — N2889 Other specified disorders of kidney and ureter: Secondary | ICD-10-CM

## 2020-04-18 ENCOUNTER — Other Ambulatory Visit: Payer: Self-pay | Admitting: Obstetrics and Gynecology

## 2020-09-29 IMAGING — CR DG CHEST 2V
2 series · 2 of 2 positions shown · non-contrast
Comparison: 12/30/2013

CLINICAL DATA: Renal mass

EXAM:
CHEST - 2 VIEW

[w chest pa]
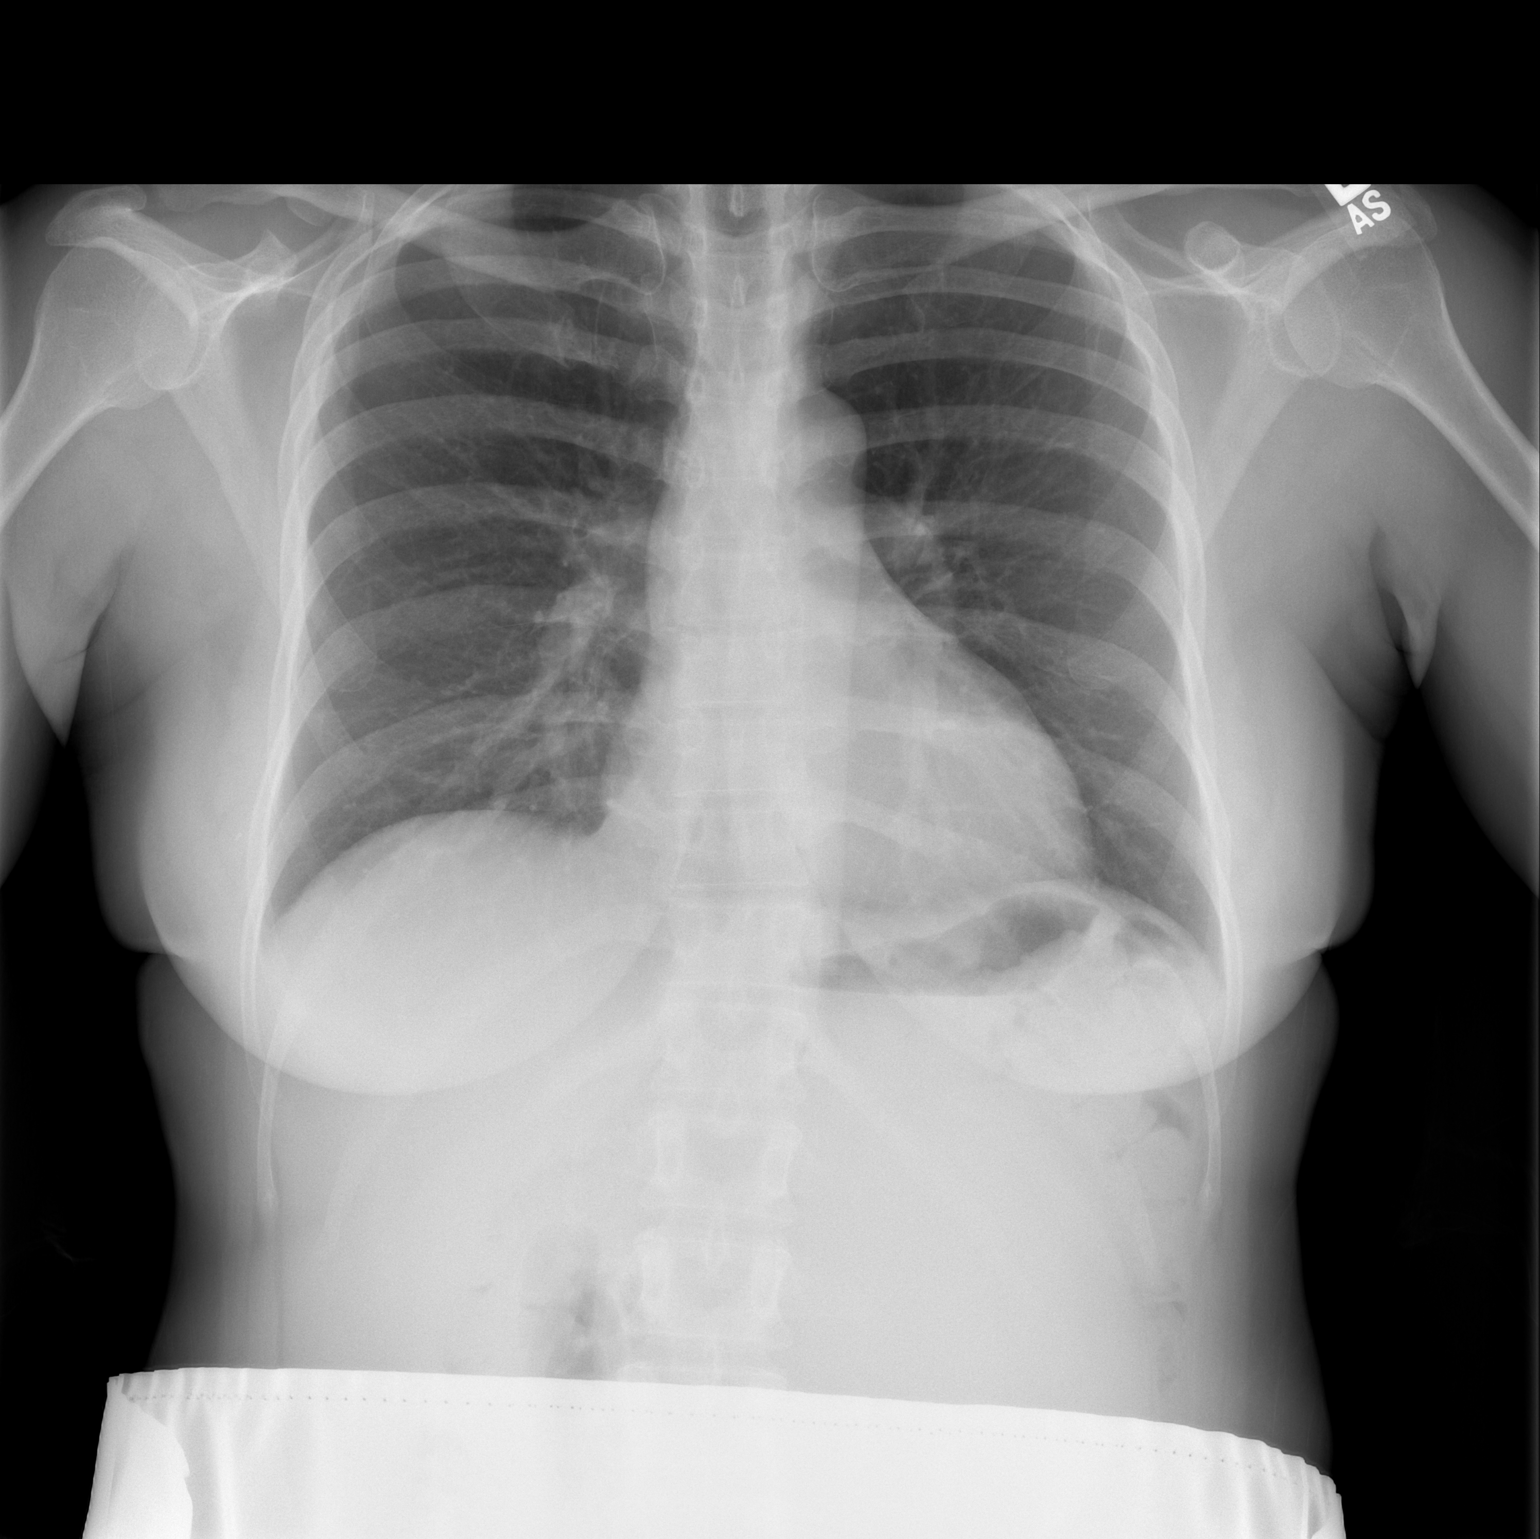

[w chest lat]
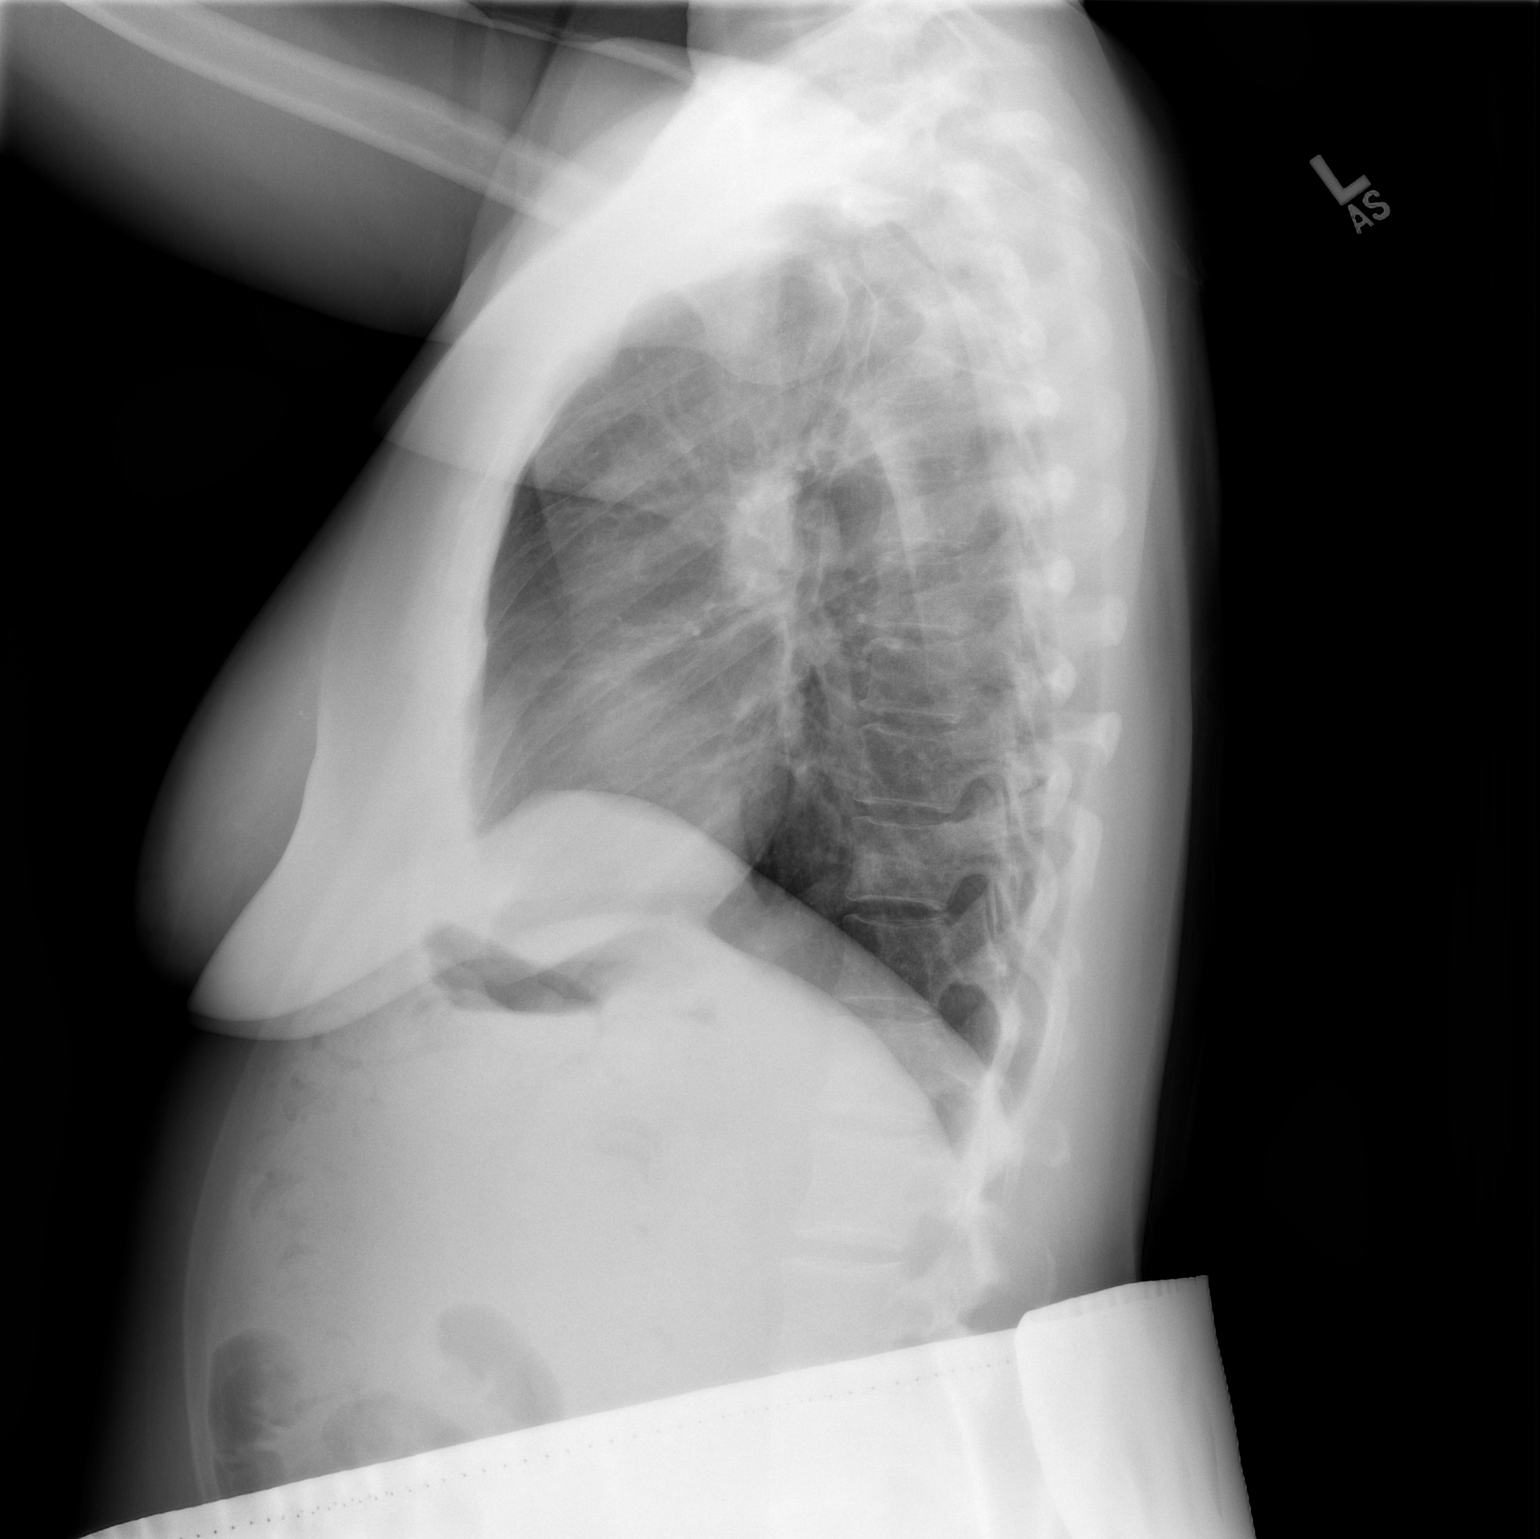

[2 of 2 positions shown; findings below may reference images not displayed]

FINDINGS: The heart size and mediastinal contours are within normal limits.
Both lungs are clear. The visualized skeletal structures are
unremarkable.
IMPRESSION: No acute abnormality of the lungs. No radiographic evidence of
metastatic disease in the chest given reported renal mass.

## 2021-05-24 ENCOUNTER — Other Ambulatory Visit: Payer: Self-pay | Admitting: Obstetrics and Gynecology

## 2021-05-24 DIAGNOSIS — D259 Leiomyoma of uterus, unspecified: Secondary | ICD-10-CM

## 2021-06-05 ENCOUNTER — Encounter: Payer: Self-pay | Admitting: *Deleted

## 2021-06-05 ENCOUNTER — Ambulatory Visit
Admission: RE | Admit: 2021-06-05 | Discharge: 2021-06-05 | Disposition: A | Discharge status: Home / Self Care | Payer: 59 | Source: Ambulatory Visit | Attending: Obstetrics and Gynecology | Admitting: Obstetrics and Gynecology

## 2021-06-05 ENCOUNTER — Other Ambulatory Visit: Payer: Self-pay

## 2021-06-05 DIAGNOSIS — D259 Leiomyoma of uterus, unspecified: Secondary | ICD-10-CM

## 2021-06-05 HISTORY — PX: IR RADIOLOGIST EVAL & MGMT: IMG5224

## 2021-06-05 NOTE — Consult Note (Signed)
Chief Complaint: Patient was consulted remotely today (TeleHealth) for No chief complaint on file.  at the request of Stephanie Ayers.    Referring Physician(s): Stephanie Ayers  History of Present Illness: Stephanie Ayers is a 45 y.o. female presenting today as a scheduled consultation for symptomatic uterine fibroids, kindly referred by Stephanie Ayers.  She joins [place] today by telemedicine, given the current COVID situation.  We confirmed her identity with 2 identifiers.   She complains of 2 of 3 categories of symptoms of fibroids, bleeding and bulk symptoms.   She tells me that regarding her bleeding symptoms, she has had worsening over the past years.  She experiecnes  heavy bleeding with clotting, with frequent pad/tampon changes. She has break through spotting outside of this week as well.  She does not have excessive cramping or pain.  Regarding bulk symptoms she experiences bladder pressure and dyspareunia.   She has 1 daughter, 52 years old, and has no intention of further pregnancy.    Past Medical History:  Diagnosis Date   Asthma     Past Surgical History:  Procedure Laterality Date   IR RADIOLOGIST EVAL & MGMT  06/05/2021    Allergies: Shellfish allergy  Medications: Prior to Admission medications   Medication Sig Start Date End Date Taking? Authorizing Provider  cholecalciferol (VITAMIN D) 1000 UNITS tablet Take 1,000 Units by mouth daily.    [provider]  Cranberry-Vitamin C-Probiotic (AZO CRANBERRY PO) Take 2 tablets by mouth once.    [provider]  ibuprofen (ADVIL,MOTRIN) 600 MG tablet Take 1 tablet (600 mg total) by mouth every 6 (six) hours as needed. 12/30/13   Julianne Rice, MD  nitrofurantoin, macrocrystal-monohydrate, (MACROBID) 100 MG capsule Take 1 capsule (100 mg total) by mouth 2 (two) times daily. 12/30/13   Julianne Rice, MD     No family history on file.  Social History   Socioeconomic History    Marital status: Single    Spouse name: Not on file   Number of children: Not on file   Years of education: Not on file   Highest education level: Not on file  Occupational History   Not on file  Tobacco Use   Smoking status: Never   Smokeless tobacco: Not on file  Substance and Sexual Activity   Alcohol use: Yes    Alcohol/week: 1.0 standard drink    Types: 1 Glasses of wine per week    Comment: occassional   Drug use: No   Sexual activity: Not on file  Other Topics Concern   Not on file  Social History Narrative   Not on file   Social Determinants of Health   Financial Resource Strain: Not on file  Food Insecurity: Not on file  Transportation Needs: Not on file  Physical Activity: Not on file  Stress: Not on file  Social Connections: Not on file   Review of Systems  Review of Systems: A 12 point ROS discussed and pertinent positives are indicated in the HPI above.  All other systems are negative.  Physical Exam No direct physical exam was performed  Vital Signs: There were no vitals taken for this visit.  Assessment and Plan:  Stephanie Ayers is a 45 yo female with symptomatic uterine fibroids, and is a candidate for uterine artery embolization.   I had a lengthy discussion with her regarding the anatomy, pathology, and treatment options for fibroids.  Specifically, I mentioned hormonal therapy, surgical hysterectomy/myomectomy, and uterine fibroid embolization.  Regarding UFE, we had a discussion regarding the efficacy, expectations regarding outcomes/long term efficacy, and risk/benefit.   I shared with her a summary of the literature for UFE efficacy, which generally is accepted to be adequate symptom relief with good restoration of quality of life at 1 year in 90% or greater of patients for bleeding>pain> bulk symptoms.  I also told her that there is a cited rate of retreatment in ~15-20% of patients in the long term, with overall excellent long term utility of  symptom relief and QOL.    We discussed the expectations not just for the treatment day/23 hour observation, but the first 3 months, 6 months - 12 months, and our follow up.   Regarding risks, specific risks discussed include: post-embolization syndrome, bleeding, infection, contrast reaction, kidney/artery injury, need for further surgery/procedure, including hysterectomy, need for hospitalization, cardiopulmonary collapse, death.    Regarding post-embolization syndrome, I did let her know that this is essentially expected, with a typical prodromal syndrome lasting 4-7 days typically, and usually treated with medication/support such as hydration, rest, analgesics, PO nausea medications, and stool softeners.   We also discussed the need for MRI and possibility that she may be excluded by findings, or possibly requiring a follow up conversation if we feel that adenomyosis is present/contributing.  After discussing, she would like to proceed with treatment. Her intention is to be treated after August, 2022 as she is planning on going on a cruise at the end of August.  I think this is a reasonable amount of time to wait prior to treatment.  Plan prior to UFE: - MRI to evaluate uterus/fibroids - Obtain Endometrial Biopsy  Thank you for this interesting consult.  I greatly enjoyed meeting Stephanie Ayers and look forward to participating in their care.  A copy of this report was sent to the requesting provider on this date.  Electronically Signed: Paula Libra Aroldo Galli 06/05/2021, 4:27 PM   I spent a total of 30 Minutes in remote  clinical consultation, greater than 50% of which was counseling/coordinating care for Uterine Fibroid Embolization.    Visit type: Audio only (telephone). Audio (no video) only due to patient's lack of internet/smartphone capability. Alternative for in-person consultation at [facility], [address] [place], [place], [place]. This visit type was conducted due to national  recommendations for restrictions regarding the COVID-19 Pandemic (e.g. social distancing).  This format is felt to be most appropriate for this patient at this time.  All issues noted in this document were discussed and addressed.

## 2021-06-06 ENCOUNTER — Other Ambulatory Visit: Payer: Self-pay | Admitting: Interventional Radiology

## 2021-06-06 DIAGNOSIS — D259 Leiomyoma of uterus, unspecified: Secondary | ICD-10-CM

## 2021-09-01 ENCOUNTER — Ambulatory Visit (HOSPITAL_COMMUNITY): Admission: RE | Admit: 2021-09-01 | Payer: 59 | Source: Ambulatory Visit

## 2022-06-19 ENCOUNTER — Other Ambulatory Visit: Payer: Self-pay | Admitting: Obstetrics and Gynecology

## 2022-06-19 DIAGNOSIS — D259 Leiomyoma of uterus, unspecified: Secondary | ICD-10-CM

## 2022-06-21 ENCOUNTER — Other Ambulatory Visit: Payer: Self-pay | Admitting: Interventional Radiology

## 2022-06-21 DIAGNOSIS — D259 Leiomyoma of uterus, unspecified: Secondary | ICD-10-CM

## 2022-07-01 ENCOUNTER — Telehealth: Payer: 59

## 2022-07-17 ENCOUNTER — Ambulatory Visit (HOSPITAL_COMMUNITY)
Admission: RE | Admit: 2022-07-17 | Discharge: 2022-07-17 | Disposition: A | Discharge status: Home / Self Care | Payer: Federal, State, Local not specified - PPO | Source: Ambulatory Visit | Attending: Interventional Radiology | Admitting: Interventional Radiology

## 2022-07-17 DIAGNOSIS — D259 Leiomyoma of uterus, unspecified: Secondary | ICD-10-CM | POA: Diagnosis present

## 2022-07-17 MED ORDER — GADOBUTROL 1 MMOL/ML IV SOLN
7.0000 mL | Freq: Once | INTRAVENOUS | Status: AC | PRN
  Administered 2022-07-17: 7 mL via INTRAVENOUS

## 2022-07-18 ENCOUNTER — Ambulatory Visit
Admission: RE | Admit: 2022-07-18 | Discharge: 2022-07-18 | Disposition: A | Discharge status: Home / Self Care | Payer: Federal, State, Local not specified - PPO | Source: Ambulatory Visit | Attending: Obstetrics and Gynecology | Admitting: Obstetrics and Gynecology

## 2022-07-18 DIAGNOSIS — D259 Leiomyoma of uterus, unspecified: Secondary | ICD-10-CM

## 2022-07-18 HISTORY — PX: IR RADIOLOGIST EVAL & MGMT: IMG5224

## 2022-07-18 NOTE — Progress Notes (Signed)
Chief Complaint: Uterine Fibroids  Referring Physician(s): Cousins,Sheronette  History of Present Illness: Stephanie Ayers is a 46 y.o. female with history of symptomatic fibroids.  I initially saw her in in clinic on 06/05/2021.  She returns to IR clinic today to discuss the results of her pelvic MRI from 07/17/2022.  She still has 2 out of 3 categories of symptoms of fibroids, bleeding and bulk.  Her symptoms have no significantly changed in frequency or intensity since her last visit. She experiecnes  heavy bleeding with clotting, with frequent pad/tampon changes. She has break through spotting outside of this week as well. Regarding bulk symptoms she experiences bladder pressure and dyspareunia.   She has 1 daughter and has no intention of further pregnancy.    Past Medical History:  Diagnosis Date   Asthma     Past Surgical History:  Procedure Laterality Date   IR RADIOLOGIST EVAL & MGMT  06/05/2021    Allergies: Shellfish allergy  Medications: Prior to Admission medications   Medication Sig Start Date End Date Taking? Authorizing Provider  cholecalciferol (VITAMIN D) 1000 UNITS tablet Take 1,000 Units by mouth daily.    [provider]  Cranberry-Vitamin C-Probiotic (AZO CRANBERRY PO) Take 2 tablets by mouth once.    [provider]  ibuprofen (ADVIL,MOTRIN) 600 MG tablet Take 1 tablet (600 mg total) by mouth every 6 (six) hours as needed. 12/30/13   Julianne Rice, MD  nitrofurantoin, macrocrystal-monohydrate, (MACROBID) 100 MG capsule Take 1 capsule (100 mg total) by mouth 2 (two) times daily. 12/30/13   Julianne Rice, MD     No family history on file.  Social History   Socioeconomic History   Marital status: Single    Spouse name: Not on file   Number of children: Not on file   Years of education: Not on file   Highest education level: Not on file  Occupational History   Not on file  Tobacco Use   Smoking status: Never   Smokeless  tobacco: Not on file  Substance and Sexual Activity   Alcohol use: Yes    Alcohol/week: 1.0 standard drink of alcohol    Types: 1 Glasses of wine per week    Comment: occassional   Drug use: No   Sexual activity: Not on file  Other Topics Concern   Not on file  Social History Narrative   Not on file   Social Determinants of Health   Financial Resource Strain: Not on file  Food Insecurity: Not on file  Transportation Needs: Not on file  Physical Activity: Not on file  Stress: Not on file  Social Connections: Not on file   Review of Systems  Review of Systems: A 12 point ROS discussed and pertinent positives are indicated in the HPI above.  All other systems are negative.  Physical Exam No direct physical exam was performed (except for noted visual exam findings with Video Visits).   Vital Signs: There were no vitals taken for this visit.  Imaging: MR PELVIS W WO CONTRAST  Result Date: 07/17/2022 CLINICAL DATA:  Uterine fibroids.  Treatment planning. EXAM: MRI PELVIS WITHOUT AND WITH CONTRAST TECHNIQUE: Multiplanar multisequence MR imaging of the pelvis was performed both before and after administration of intravenous contrast. CONTRAST:  57m GADAVIST GADOBUTROL 1 MMOL/ML IV SOLN COMPARISON:  None Available. FINDINGS: Lower Urinary Tract: No bladder or urethral abnormality identified. Bowel:  Unremarkable visualized pelvic bowel loops. Vascular/Lymphatic: No pathologically enlarged lymph nodes or other significant abnormality.  Reproductive: -- Uterus: Measures 13.5 by 7.6 by 11.0 cm (volume = 590 cm^3). Numerous fibroids are seen which involve the uterus diffusely, and are submucosal, intramural, and subserosal in location. These range in size from 2 cm to 6.0 cm in maximum diameter. -- Intracavitary fibroids:  None. -- Pedunculated fibroids: Largest 6.0 cm fibroid arises from the right lateral uterine fundus, and is pedunculated soft tissue pedicle measuring 3.9 cm in thickness. --  Fibroid contrast enhancement: All fibroids show contrast enhancement, without significant degeneration/devascularization. -- Right ovary:  Appears normal.  No mass identified. -- Left ovary:  Appears normal.  No mass identified. Other: No abnormal free fluid. A 5.8 cm complex cystic lesion is seen in the midpole the left kidney which contains internal septations. This is incompletely characterized on the current exam. Musculoskeletal:  Unremarkable. IMPRESSION: Diffuse uterine involvement by numerous fibroids. Largest 6 cm fibroid is pedunculated, arising from the right lateral uterine fundus, with soft tissue pedicle measuring 3.9 cm in thickness. No intracavitary fibroids identified. Normal appearance of both ovaries. No adnexal mass identified. Electronically Signed   By: Marlaine Hind M.D.   On: 07/17/2022 14:40    Labs:  CBC: No results for input(s): "WBC", "HGB", "HCT", "PLT" in the last 8760 hours.  COAGS: No results for input(s): "INR", "APTT" in the last 8760 hours.  BMP: No results for input(s): "NA", "K", "CL", "CO2", "GLUCOSE", "BUN", "CALCIUM", "CREATININE", "GFRNONAA", "GFRAA" in the last 8760 hours.  Invalid input(s): "CMP"  LIVER FUNCTION TESTS: No results for input(s): "BILITOT", "AST", "ALT", "ALKPHOS", "PROT", "ALBUMIN" in the last 8760 hours.  TUMOR MARKERS: No results for input(s): "AFPTM", "CEA", "CA199", "CHROMGRNA" in the last 8760 hours.  Assessment and Plan:  [name] is a 46 year old woman symptomatic uterine fibroids and is a candidate for uterine artery embolization.    She initially saw me in IR clinic on 06/05/2021 regarding UFE.  Her MRI performed on 07/17/2022 shows multiple fibroids which are amenable to embolization.  Endometrial biopsy performed on July, 2022 shows no evidence of malignancy.  We again dicussed the anatomy, pathology, and treatment options for fibroids.     We discussed the efficacy, expectations regarding outcomes/long term efficacy, and  risk/benefit.   I shared with her a summary of the literature for UFE efficacy, which generally is accepted to be adequate symptom relief with good restoration of quality of life at 1 year in 90% or greater of patients for bleeding>pain> bulk symptoms.  I also told her that there is a cited rate of retreatment in ~15-20% of patients in the long term, with overall excellent long term utility of symptom relief and QOL.     We discussed the expectations not just for the treatment day/23 hour observation, but the first 3 months, 6 months - 12 months, and our follow up.    Regarding risks, specific risks discussed include: post-embolization syndrome, bleeding, infection, contrast reaction, kidney/artery injury, need for further surgery/procedure, including hysterectomy, need for hospitalization, cardiopulmonary collapse, death.     Regarding post-embolization syndrome, I did let her know that this is essentially expected, with a typical prodromal syndrome lasting 4-7 days typically, and usually treated with medication/support such as hydration, rest, analgesics, PO nausea medications, and stool softeners.    After discussing, she would like to proceed with treatment.    Plan: Schedule for UFE at [facility] at patient's convenience.  Thank you for this interesting consult.  I greatly enjoyed meeting Dezra ANNISTEN MANCHESTER and look forward to  participating in their care.  A copy of this report was sent to the requesting provider on this date.  Electronically Signed: Paula Libra Voncile Schwarz 07/18/2022, 12:15 PM   I spent a total of    15 Minutes in remote  clinical consultation, greater than 50% of which was counseling/coordinating care for Uterine Fibroid Embolization.    Visit type: Audio only (telephone). Audio (no video) only due to video not being required for this visit.  Alternative for in-person consultation at [facility], [address] [place], [place], [place]. This visit type was conducted due to national  recommendations for restrictions regarding the COVID-19 Pandemic (e.g. social distancing).  This format is felt to be most appropriate for this patient at this time.  All issues noted in this document were discussed and addressed.

## 2022-07-19 ENCOUNTER — Encounter: Payer: Self-pay | Admitting: *Deleted

## 2022-08-27 ENCOUNTER — Other Ambulatory Visit (HOSPITAL_COMMUNITY): Payer: Self-pay | Admitting: Interventional Radiology

## 2022-08-27 DIAGNOSIS — D259 Leiomyoma of uterus, unspecified: Secondary | ICD-10-CM

## 2022-09-25 ENCOUNTER — Encounter: Payer: Self-pay | Admitting: *Deleted

## 2022-10-14 ENCOUNTER — Other Ambulatory Visit: Payer: Self-pay

## 2022-10-14 ENCOUNTER — Ambulatory Visit (HOSPITAL_COMMUNITY)
Admission: RE | Admit: 2022-10-14 | Discharge: 2022-10-14 | Disposition: A | Discharge status: Home / Self Care | Payer: Federal, State, Local not specified - PPO | Source: Ambulatory Visit

## 2022-10-14 ENCOUNTER — Encounter (HOSPITAL_COMMUNITY): Payer: Self-pay

## 2022-10-14 ENCOUNTER — Observation Stay (HOSPITAL_COMMUNITY)
Admission: RE | Admit: 2022-10-14 | Discharge: 2022-10-15 | Disposition: A | Discharge status: Home / Self Care | Payer: Federal, State, Local not specified - PPO | Source: Ambulatory Visit | Attending: Interventional Radiology | Admitting: Interventional Radiology

## 2022-10-14 VITALS — BP 140/94 | HR 70 | Temp 98.8°F | Resp 18 | Ht 62.0 in | Wt 150.0 lb

## 2022-10-14 DIAGNOSIS — J45909 Unspecified asthma, uncomplicated: Secondary | ICD-10-CM | POA: Diagnosis not present

## 2022-10-14 DIAGNOSIS — Z79899 Other long term (current) drug therapy: Secondary | ICD-10-CM | POA: Insufficient documentation

## 2022-10-14 DIAGNOSIS — D259 Leiomyoma of uterus, unspecified: Secondary | ICD-10-CM | POA: Diagnosis present

## 2022-10-14 HISTORY — PX: IR US GUIDE VASC ACCESS LEFT: IMG2389

## 2022-10-14 HISTORY — PX: IR ANGIOGRAM PELVIS SELECTIVE OR SUPRASELECTIVE: IMG661

## 2022-10-14 HISTORY — PX: IR EMBO TUMOR ORGAN ISCHEMIA INFARCT INC GUIDE ROADMAPPING: IMG5449

## 2022-10-14 HISTORY — PX: IR ANGIOGRAM SELECTIVE EACH ADDITIONAL VESSEL: IMG667

## 2022-10-14 LAB — CBC WITH DIFFERENTIAL/PLATELET
Abs Immature Granulocytes: 0.01 10*3/uL (ref 0.00–0.07)
Basophils Absolute: 0 10*3/uL (ref 0.0–0.1)
Basophils Relative: 0 %
Eosinophils Absolute: 0.1 10*3/uL (ref 0.0–0.5)
Eosinophils Relative: 2 %
HCT: 36 % (ref 36.0–46.0)
Hemoglobin: 12 g/dL (ref 12.0–15.0)
Immature Granulocytes: 0 %
Lymphocytes Relative: 46 %
Lymphs Abs: 1.4 10*3/uL (ref 0.7–4.0)
MCH: 30.8 pg (ref 26.0–34.0)
MCHC: 33.3 g/dL (ref 30.0–36.0)
MCV: 92.3 fL (ref 80.0–100.0)
Monocytes Absolute: 0.3 10*3/uL (ref 0.1–1.0)
Monocytes Relative: 9 %
Neutro Abs: 1.3 10*3/uL — ABNORMAL LOW (ref 1.7–7.7)
Neutrophils Relative %: 43 %
Platelets: 270 10*3/uL (ref 150–400)
RBC: 3.9 MIL/uL (ref 3.87–5.11)
RDW: 13.8 % (ref 11.5–15.5)
WBC: 3.1 10*3/uL — ABNORMAL LOW (ref 4.0–10.5)
nRBC: 0 % (ref 0.0–0.2)

## 2022-10-14 LAB — PROTIME-INR
INR: 1.1 (ref 0.8–1.2)
Prothrombin Time: 13.6 seconds (ref 11.4–15.2)

## 2022-10-14 LAB — BASIC METABOLIC PANEL
Anion gap: 7 (ref 5–15)
BUN: 11 mg/dL (ref 6–20)
CO2: 27 mmol/L (ref 22–32)
Calcium: 8.9 mg/dL (ref 8.9–10.3)
Chloride: 106 mmol/L (ref 98–111)
Creatinine, Ser: 0.81 mg/dL (ref 0.44–1.00)
GFR, Estimated: >60 mL/min (ref >60)
Glucose, Bld: 96 mg/dL (ref 70–99)
Potassium: 3.7 mmol/L (ref 3.5–5.1)
Sodium: 140 mmol/L (ref 135–145)

## 2022-10-14 LAB — HCG, SERUM, QUALITATIVE: Preg, Serum: NEGATIVE

## 2022-10-14 MED ORDER — SODIUM CHLORIDE 0.9 % IV SOLN: INTRAVENOUS | Status: DC

## 2022-10-14 MED ORDER — ONDANSETRON HCL 4 MG/2ML IJ SOLN: 4.0000 mg | Freq: Four times a day (QID) | INTRAMUSCULAR | Status: DC | PRN

## 2022-10-14 MED ORDER — ONDANSETRON HCL 4 MG/2ML IJ SOLN
4.0000 mg | Freq: Four times a day (QID) | INTRAMUSCULAR | Status: DC | PRN
  Administered 2022-10-14 (×2): 4 mg via INTRAVENOUS
  Filled 2022-10-14 (×2): qty 2

## 2022-10-14 MED ORDER — KETOROLAC TROMETHAMINE 30 MG/ML IJ SOLN
30.0000 mg | Freq: Once | INTRAMUSCULAR | Status: AC
  Administered 2022-10-14: 30 mg via INTRAVENOUS
  Filled 2022-10-14: qty 1

## 2022-10-14 MED ORDER — DIPHENHYDRAMINE HCL 50 MG/ML IJ SOLN: 12.5000 mg | Freq: Four times a day (QID) | INTRAMUSCULAR | Status: DC | PRN

## 2022-10-14 MED ORDER — HYDROMORPHONE 1 MG/ML IV SOLN
INTRAVENOUS | Status: AC
  Administered 2022-10-14: 1.2 mg via INTRAVENOUS
  Administered 2022-10-14: 3 mg via INTRAVENOUS
  Administered 2022-10-15 (×2): 0.9 mg via INTRAVENOUS
  Administered 2022-10-15: 0.6 mg via INTRAVENOUS
  Filled 2022-10-14: qty 30

## 2022-10-14 MED ORDER — HEPARIN SODIUM (PORCINE) 1000 UNIT/ML IJ SOLN
INTRAMUSCULAR | Status: AC
  Filled 2022-10-14: qty 10

## 2022-10-14 MED ORDER — DOCUSATE SODIUM 100 MG PO CAPS
100.0000 mg | ORAL_CAPSULE | Freq: Two times a day (BID) | ORAL | Status: DC
  Administered 2022-10-14 – 2022-10-15 (×3): 100 mg via ORAL
  Filled 2022-10-14 (×3): qty 1

## 2022-10-14 MED ORDER — IOHEXOL 300 MG/ML  SOLN
100.0000 mL | Freq: Once | INTRAMUSCULAR | Status: AC | PRN
  Administered 2022-10-14: 20 mL via INTRA_ARTERIAL

## 2022-10-14 MED ORDER — CEFAZOLIN SODIUM-DEXTROSE 2-4 GM/100ML-% IV SOLN
2.0000 g | Freq: Once | INTRAVENOUS | Status: AC
  Administered 2022-10-14: 2 g via INTRAVENOUS
  Filled 2022-10-14: qty 100

## 2022-10-14 MED ORDER — DIPHENHYDRAMINE HCL 12.5 MG/5ML PO ELIX
12.5000 mg | ORAL_SOLUTION | Freq: Four times a day (QID) | ORAL | Status: DC | PRN
  Filled 2022-10-14: qty 5

## 2022-10-14 MED ORDER — MIDAZOLAM HCL 2 MG/2ML IJ SOLN
INTRAMUSCULAR | Status: DC | PRN
  Administered 2022-10-14 (×2): 1 mg via INTRAVENOUS

## 2022-10-14 MED ORDER — LIDOCAINE HCL 1 % IJ SOLN
INTRAMUSCULAR | Status: DC | PRN
  Administered 2022-10-14: 5 mL

## 2022-10-14 MED ORDER — NALOXONE HCL 0.4 MG/ML IJ SOLN: 0.4000 mg | INTRAMUSCULAR | Status: DC | PRN

## 2022-10-14 MED ORDER — PROMETHAZINE HCL 25 MG RE SUPP: 25.0000 mg | Freq: Three times a day (TID) | RECTAL | Status: DC | PRN

## 2022-10-14 MED ORDER — SODIUM CHLORIDE 0.9% FLUSH: 9.0000 mL | INTRAVENOUS | Status: DC | PRN

## 2022-10-14 MED ORDER — KETOROLAC TROMETHAMINE 30 MG/ML IJ SOLN
30.0000 mg | Freq: Four times a day (QID) | INTRAMUSCULAR | Status: AC
  Administered 2022-10-14 – 2022-10-15 (×3): 30 mg via INTRAVENOUS
  Filled 2022-10-14 (×2): qty 1

## 2022-10-14 MED ORDER — TRAZODONE HCL 100 MG PO TABS
100.0000 mg | ORAL_TABLET | Freq: Every day | ORAL | Status: DC
  Administered 2022-10-14: 100 mg via ORAL
  Filled 2022-10-14: qty 1

## 2022-10-14 MED ORDER — PROMETHAZINE HCL 25 MG PO TABS
25.0000 mg | ORAL_TABLET | Freq: Three times a day (TID) | ORAL | Status: DC | PRN
  Administered 2022-10-14: 25 mg via ORAL
  Filled 2022-10-14 (×2): qty 1

## 2022-10-14 MED ORDER — SODIUM CHLORIDE 0.9 % IV SOLN: 250.0000 mL | INTRAVENOUS | Status: DC | PRN

## 2022-10-14 MED ORDER — FENTANYL CITRATE (PF) 100 MCG/2ML IJ SOLN
INTRAMUSCULAR | Status: DC | PRN
  Administered 2022-10-14 (×2): 50 ug via INTRAVENOUS

## 2022-10-14 MED ORDER — LIP MEDEX EX OINT
TOPICAL_OINTMENT | CUTANEOUS | Status: DC | PRN
  Filled 2022-10-14: qty 7

## 2022-10-14 MED ORDER — VERAPAMIL HCL 2.5 MG/ML IV SOLN
INTRAVENOUS | Status: AC
  Filled 2022-10-14: qty 2

## 2022-10-14 MED ORDER — OXYCODONE-ACETAMINOPHEN 5-325 MG PO TABS
1.0000 | ORAL_TABLET | ORAL | Status: DC | PRN
  Administered 2022-10-15: 1 via ORAL
  Filled 2022-10-14 (×2): qty 1

## 2022-10-14 MED ORDER — SODIUM CHLORIDE 0.9% FLUSH
3.0000 mL | INTRAVENOUS | Status: DC | PRN
  Administered 2022-10-15: 3 mL via INTRAVENOUS

## 2022-10-14 MED ORDER — SODIUM CHLORIDE 0.9% FLUSH
3.0000 mL | Freq: Two times a day (BID) | INTRAVENOUS | Status: DC
  Administered 2022-10-14 – 2022-10-15 (×2): 3 mL via INTRAVENOUS

## 2022-10-14 MED ORDER — ENOXAPARIN SODIUM 40 MG/0.4ML IJ SOSY
40.0000 mg | PREFILLED_SYRINGE | INTRAMUSCULAR | Status: DC
  Administered 2022-10-14: 40 mg via SUBCUTANEOUS
  Filled 2022-10-14: qty 0.4

## 2022-10-14 MED ORDER — LIDOCAINE HCL 1 % IJ SOLN
INTRAMUSCULAR | Status: AC
  Filled 2022-10-14: qty 20

## 2022-10-14 MED ORDER — MIDAZOLAM HCL 2 MG/2ML IJ SOLN
INTRAMUSCULAR | Status: AC
  Filled 2022-10-14: qty 4

## 2022-10-14 MED ORDER — FENTANYL CITRATE (PF) 100 MCG/2ML IJ SOLN
INTRAMUSCULAR | Status: AC
  Filled 2022-10-14: qty 2

## 2022-10-14 MED ORDER — NITROGLYCERIN 1 MG/10 ML FOR IR/CATH LAB
INTRA_ARTERIAL | Status: DC | PRN
  Administered 2022-10-14: 200 ug

## 2022-10-14 MED ORDER — NITROGLYCERIN IN D5W 100-5 MCG/ML-% IV SOLN
INTRAVENOUS | Status: AC
  Filled 2022-10-14: qty 250

## 2022-10-14 MED ORDER — VERAPAMIL HCL 2.5 MG/ML IV SOLN
INTRA_ARTERIAL | Status: DC | PRN
  Administered 2022-10-14: 6 mL via INTRA_ARTERIAL

## 2022-10-14 NOTE — Procedures (Signed)
Interventional Radiology Procedure Note  Procedure: Uterine Artery Embolization  Indication: Uterine Fibroids  Findings: Please refer to procedural dictation for full description.  Complications: None  EBL: < 10 mL  Miachel Roux, MD [phone]

## 2022-10-14 NOTE — Consult Note (Signed)
Chief Complaint: Patient was seen in consultation today for  bilateral uterine artery embolization  Referring Physician(s): Cousins,S  Supervising Physician: Mir, Sharen Heck  Patient Status: [facility] - Out-pt  History of Present Illness: Stephanie Ayers is a 46 y.o. female with history of asthma and symptomatic uterine fibroids. She underwent consultation with Dr. Dwaine Gale on 06/05/2021 to discuss treatment options for her uterine fibroids and was deemed an appropriate candidate for bilateral uterine artery embolization.  She presents today for the procedure.  Past Medical History:  Diagnosis Date   Asthma     Past Surgical History:  Procedure Laterality Date   IR RADIOLOGIST EVAL & MGMT  06/05/2021   IR RADIOLOGIST EVAL & MGMT  07/18/2022    Allergies: Shellfish allergy  Medications: Prior to Admission medications   Medication Sig Start Date End Date Taking? Authorizing Provider  ASHWAGANDHA PO Take 1 tablet by mouth as needed.   Yes [provider]  cholecalciferol (VITAMIN D) 1000 UNITS tablet Take 1,000 Units by mouth daily.   Yes [provider]  traZODone (DESYREL) 100 MG tablet Take 100 mg by mouth at bedtime.   Yes [provider]  Cranberry-Vitamin C-Probiotic (AZO CRANBERRY PO) Take 2 tablets by mouth once.    [provider]  ibuprofen (ADVIL,MOTRIN) 600 MG tablet Take 1 tablet (600 mg total) by mouth every 6 (six) hours as needed. 12/30/13   Julianne Rice, MD  nitrofurantoin, macrocrystal-monohydrate, (MACROBID) 100 MG capsule Take 1 capsule (100 mg total) by mouth 2 (two) times daily. 12/30/13   Julianne Rice, MD     History reviewed. No pertinent family history.  Social History   Socioeconomic History   Marital status: Single    Spouse name: Not on file   Number of children: Not on file   Years of education: Not on file   Highest education level: Not on file  Occupational History   Not on file  Tobacco Use   Smoking  status: Never   Smokeless tobacco: Not on file  Vaping Use   Vaping Use: Never used  Substance and Sexual Activity   Alcohol use: Yes    Alcohol/week: 1.0 standard drink of alcohol    Types: 1 Glasses of wine per week    Comment: occassional   Drug use: No   Sexual activity: Not on file  Other Topics Concern   Not on file  Social History Narrative   Not on file   Social Determinants of Health   Financial Resource Strain: Not on file  Food Insecurity: Not on file  Transportation Needs: Not on file  Physical Activity: Not on file  Stress: Not on file  Social Connections: Not on file      Review of Systems she currently denies fever, headache, chest pain, dyspnea, cough, back pain, nausea, vomiting.  She does have some mild anterior pelvic discomfort and history of menorrhagia.  Vital Signs: BP (!) 129/94   Pulse 73   Temp 98.1 F (36.7 C) (Oral)   Resp 16   Ht '5\' 2"'$  (1.575 m)   Wt 150 lb (68 kg)   LMP 10/03/2022 (Exact Date)   SpO2 98%   BMI 27.44 kg/m     Physical Exam awake, alert.  Chest clear to auscultation bilaterally.  Heart with regular rate and rhythm.  Abdomen soft, positive bowel sounds, mildly tender anterior pelvic region to palpation.  No lower extremity edema.  Intact distal pulses.  Imaging: No results found.  Labs:  CBC: Recent Labs    10/14/22 0800  WBC 3.1*  HGB 12.0  HCT 36.0  PLT 270    COAGS: Recent Labs    10/14/22 0800  INR 1.1    BMP: No results for input(s): "NA", "K", "CL", "CO2", "GLUCOSE", "BUN", "CALCIUM", "CREATININE", "GFRNONAA", "GFRAA" in the last 8760 hours.  Invalid input(s): "CMP"  LIVER FUNCTION TESTS: No results for input(s): "BILITOT", "AST", "ALT", "ALKPHOS", "PROT", "ALBUMIN" in the last 8760 hours.  TUMOR MARKERS: No results for input(s): "AFPTM", "CEA", "CA199", "CHROMGRNA" in the last 8760 hours.  Assessment and Plan: 46 y.o. female with history of asthma and symptomatic uterine fibroids. She  underwent consultation with Dr. Dwaine Gale on 06/05/2021 to discuss treatment options for her uterine fibroids and was deemed an appropriate candidate for bilateral uterine artery embolization.  She presents today for the procedure.Risks and benefits of procedure were discussed with the patient including, but not limited to bleeding, infection, vascular injury or contrast induced renal failure.  This interventional procedure involves the use of X-rays and because of the nature of the planned procedure, it is possible that we will have prolonged use of X-ray fluoroscopy.  Potential radiation risks to you include (but are not limited to) the following: - A slightly elevated risk for cancer  several years later in life. This risk is typically less than 0.5% percent. This risk is low in comparison to the normal incidence of human cancer, which is 33% for women and 50% for men according to the [place]. - Radiation induced injury can include skin redness, resembling a rash, tissue breakdown / ulcers and hair loss (which can be temporary or permanent).   The likelihood of either of these occurring depends on the difficulty of the procedure and whether you are sensitive to radiation due to previous procedures, disease, or genetic conditions.   IF your procedure requires a prolonged use of radiation, you will be notified and given written instructions for further action.  It is your responsibility to monitor the irradiated area for the 2 weeks following the procedure and to notify your physician if you are concerned that you have suffered a radiation induced injury.    All of the patient's questions were answered, patient is agreeable to proceed.  Consent signed and in chart.  Postprocedure she will be admitted to the hospital for overnight observation for pain control.  LABS PENDING  Thank you for this interesting consult.  I greatly enjoyed meeting Stephanie Ayers and look forward to  participating in their care.  A copy of this report was sent to the requesting provider on this date.  Electronically Signed: D. Rowe Robert, PA-C 10/14/2022, 8:36 AM   I spent a total of 30 minutes    in face to face in clinical consultation, greater than 50% of which was counseling/coordinating care for bilateral uterine artery embolization

## 2022-10-14 NOTE — Progress Notes (Signed)
Call to bed placement. Spoke to [employer]. Acknowledged that patient was on list for inpatient bed post procedure. No bed currently available. Will assign when ready.

## 2022-10-15 ENCOUNTER — Telehealth: Payer: Self-pay | Admitting: Internal Medicine

## 2022-10-15 DIAGNOSIS — D259 Leiomyoma of uterus, unspecified: Secondary | ICD-10-CM | POA: Diagnosis not present

## 2022-10-15 MED ORDER — ONDANSETRON HCL 8 MG PO TABS: 8.0000 mg | ORAL_TABLET | Freq: Three times a day (TID) | ORAL | 0 refills | Status: DC | PRN

## 2022-10-15 MED ORDER — IBUPROFEN 600 MG PO TABS: 600.0000 mg | ORAL_TABLET | Freq: Four times a day (QID) | ORAL | 0 refills | Status: AC | PRN

## 2022-10-15 MED ORDER — DOCUSATE SODIUM 100 MG PO CAPS: 100.0000 mg | ORAL_CAPSULE | Freq: Two times a day (BID) | ORAL | 0 refills | Status: AC

## 2022-10-15 MED ORDER — OXYCODONE-ACETAMINOPHEN 5-325 MG PO TABS: 1.0000 | ORAL_TABLET | ORAL | 0 refills | Status: AC | PRN

## 2022-10-15 MED ORDER — ONDANSETRON HCL 8 MG PO TABS: 8.0000 mg | ORAL_TABLET | Freq: Three times a day (TID) | ORAL | 0 refills | Status: AC | PRN

## 2022-10-15 MED ORDER — DOCUSATE SODIUM 100 MG PO CAPS: 100.0000 mg | ORAL_CAPSULE | Freq: Two times a day (BID) | ORAL | 0 refills | Status: DC

## 2022-10-15 MED ORDER — OXYCODONE-ACETAMINOPHEN 5-325 MG PO TABS: 1.0000 | ORAL_TABLET | ORAL | 0 refills | Status: DC | PRN

## 2022-10-15 NOTE — Discharge Summary (Signed)
Patient ID: Stephanie Ayers MRN: [id number] DOB/AGE: [birth year] 46 y.o.  Admit date: 10/14/2022 Discharge date: 10/15/2022  Supervising Physician: Mir, Sharen Heck  Patient Status: [facility] - In-pt  Admission Diagnoses: Uterine leiomyoma  Discharge Diagnoses:  S/p uterine artery embolization for uterine leiomyoma  Discharged Condition: good  Hospital Course: After consultation with Dr. Dwaine Gale, pt presented to [facility] for bilateral uterine artery embolization. Pt underwent successful bilateral uterine artery angiogram and embolization for treatment of uterine fibroids with Dr. Dwaine Gale. She was admitted for overnight observation and pain control. She has done well overnight and is appropriate to discharge home. She denies N/V. Foley has been removed and she has urinated. She is ambulating to BR w/o assistance. She is having mild cramping controlled with medication.     Consults: None  Significant Diagnostic Studies: radiology:  Treatments: Bilateral uterine artery embolization   Discharge Exam: Blood pressure (!) 140/94, pulse 70, temperature 98.8 F (37.1 C), temperature source Oral, resp. rate 18, height '5\' 2"'$  (1.575 m), weight 150 lb (68 kg), last menstrual period 10/03/2022, SpO2 98 %. Physical Exam Vitals reviewed.  Constitutional:      General: She is not in acute distress.    Appearance: Normal appearance. She is not ill-appearing.  HENT:     Head: Normocephalic and atraumatic.  Eyes:     Extraocular Movements: Extraocular movements intact.     Pupils: Pupils are equal, round, and reactive to light.  Cardiovascular:     Rate and Rhythm: Normal rate and regular rhythm.     Pulses: Normal pulses.     Heart sounds: Normal heart sounds.  Pulmonary:     Effort: Pulmonary effort is normal. No respiratory distress.     Breath sounds: Normal breath sounds.  Abdominal:     General: Bowel sounds are normal. There is no distension.     Palpations: Abdomen is soft.     Tenderness: There  is no abdominal tenderness. There is no guarding.  Musculoskeletal:     Right lower leg: No edema.     Left lower leg: No edema.  Skin:    General: Skin is warm and dry.     Comments: Left radial access site is soft with no active bleeding and no appreciable pseudoaneurysm. Dressing is C/D/I     Neurological:     Mental Status: She is alert and oriented to person, place, and time.  Psychiatric:        Mood and Affect: Mood normal.        Behavior: Behavior normal.        Thought Content: Thought content normal.        Judgment: Judgment normal.      Disposition: Discharge disposition: 01-Home or Self Care       Discharge Instructions     Diet - low sodium heart healthy   Complete by: As directed    Discharge instructions   Complete by: As directed    Follow up in 3-4 weeks with Dr. Dwaine Gale via telephone. Our schedulers will call you.   Increase activity slowly   Complete by: As directed       Allergies as of 10/15/2022       Reactions   Shellfish Allergy         Medication List     TAKE these medications    albuterol 108 (90 Base) MCG/ACT inhaler Commonly known as: VENTOLIN HFA PLEASE SEE ATTACHED FOR DETAILED DIRECTIONS   ASHWAGANDHA PO Take 1 tablet  by mouth as needed.   AZO CRANBERRY PO Take 2 tablets by mouth once.   [facility] Purelax 17 GM/SCOOP powder Generic drug: polyethylene glycol powder SMARTSIG:1 Capful(s) By Mouth Daily   docusate sodium 100 MG capsule Commonly known as: Colace Take 1 capsule (100 mg total) by mouth 2 (two) times daily.   ibuprofen 600 MG tablet Commonly known as: ADVIL Take 1 tablet (600 mg total) by mouth every 6 (six) hours as needed. What changed: Another medication with the same name was added. Make sure you understand how and when to take each.   ibuprofen 600 MG tablet Commonly known as: ADVIL Take 1 tablet (600 mg total) by mouth every 6 (six) hours as needed. Take 1 tablet every 6 hours for 5 days regardless of  pain, then as needed for pain and cramping. What changed: You were already taking a medication with the same name, and this prescription was added. Make sure you understand how and when to take each.   Lomaira 8 MG Tabs Generic drug: Phentermine HCl Take 1 tablet by mouth every morning.   ondansetron 8 MG tablet Commonly known as: Zofran Take 1 tablet (8 mg total) by mouth every 8 (eight) hours as needed for nausea or vomiting.   oxyCODONE-acetaminophen 5-325 MG tablet Commonly known as: Percocet Take 1 tablet by mouth every 4 (four) hours as needed for severe pain.   traZODone 100 MG tablet Commonly known as: DESYREL Take 100 mg by mouth at bedtime.   Vitamin D (Ergocalciferol) 1.25 MG (50000 UNIT) Caps capsule Commonly known as: DRISDOL Take 50,000 Units by mouth once a week.        Follow-up Information     Mir, Paula Libra, MD Follow up in 1 month(s).   Specialties: Interventional Radiology, Diagnostic Radiology, Radiology Why: Please follow up with Dr. Dwaine Gale by phone consult in 3-4 weeks s/p UFE. Contact information: [address] [place] [address] [phone]                  Electronically Signed: Tyson Alias, NP 10/15/2022, 2:47 PM   I have spent Greater Than 30 Minutes discharging Stephanie Ayers.

## 2022-10-15 NOTE — Progress Notes (Signed)
Pt and mother given d/c instructions and updates from the IR NP. All questions were answered. Pt transported via wheelchair by nurse to front entrance.

## 2022-10-15 NOTE — Progress Notes (Signed)
First electronic prescriptions were sent to [facility]  in [place]. [facility] [employer] [employer] electronically.  Electronic receipt confirmed by pharmacy.

## 2022-10-15 NOTE — Plan of Care (Signed)

## 2022-10-15 NOTE — TOC Progression Note (Addendum)
Transition of Care [facility]) - Progression Note    Patient Details  Name: Stephanie Ayers MRN: [id number] Date of Birth: 1976/10/18  [facility]) CM/SW Contact  Servando Snare, [place] Phone Number: 10/15/2022, 11:46 AM  Clinical Narrative:    Transition of Care (TOC) Screening Note   Patient Details  Name: Stephanie Ayers Date of Birth: [birth year]   Transition of Care [facility]) CM/SW Contact:    Servando Snare, LCSW Phone Number: 10/15/2022, 11:46 AM    Transition of Care Department [facility]) has reviewed patient and no TOC needs have been identified at this time. We will continue to monitor patient advancement through interdisciplinary progression rounds. If new patient transition needs arise, please place a TOC consult.           Expected Discharge Plan and Services                                                 Social Determinants of Health (SDOH) Interventions    Readmission Risk Interventions     No data to display

## 2022-11-07 ENCOUNTER — Ambulatory Visit
Admission: RE | Admit: 2022-11-07 | Discharge: 2022-11-07 | Disposition: A | Discharge status: Home / Self Care | Payer: Federal, State, Local not specified - PPO | Source: Ambulatory Visit | Attending: Internal Medicine | Admitting: Internal Medicine

## 2022-11-07 DIAGNOSIS — D259 Leiomyoma of uterus, unspecified: Secondary | ICD-10-CM

## 2022-11-07 HISTORY — PX: IR RADIOLOGIST EVAL & MGMT: IMG5224

## 2022-11-07 NOTE — Progress Notes (Signed)
Chief Complaint: Uterine Fibroids   Referring Physician(s): Cousins,Sheronette   History of Present Illness: Stephanie Ayers is a 46 y.o. female with history of Uterine fibroids causing excessive bleeding and bladder pressure and dyspareunia. She underwent uterine artery embolization on 10/14/2022 and presents to IR clinic for follow-up.    She feels well.  Her bleeding has been somewhat irregular since the procedure.  She denies fever, chill, or worsening pelvic pain.  She has had hot flashes since the procedure, which have improved with over the counter magnesium supplements.   Past Medical History:  Diagnosis Date   Asthma     Past Surgical History:  Procedure Laterality Date   IR ANGIOGRAM PELVIS SELECTIVE OR SUPRASELECTIVE  10/14/2022   IR ANGIOGRAM SELECTIVE EACH ADDITIONAL VESSEL  10/14/2022   IR ANGIOGRAM SELECTIVE EACH ADDITIONAL VESSEL  10/14/2022   IR EMBO TUMOR ORGAN ISCHEMIA INFARCT INC GUIDE ROADMAPPING  10/14/2022   IR RADIOLOGIST EVAL & MGMT  06/05/2021   IR RADIOLOGIST EVAL & MGMT  07/18/2022   IR US GUIDE VASC ACCESS LEFT  10/14/2022    Allergies: Shellfish allergy  Medications: Prior to Admission medications   Medication Sig Start Date End Date Taking? Authorizing Provider  albuterol (VENTOLIN HFA) 108 (90 Base) MCG/ACT inhaler PLEASE SEE ATTACHED FOR DETAILED DIRECTIONS    [provider]  ASHWAGANDHA PO Take 1 tablet by mouth as needed.    [provider]  Cranberry-Vitamin C-Probiotic (AZO CRANBERRY PO) Take 2 tablets by mouth once. Patient not taking: Reported on 10/14/2022    [provider]  [employer] PURELAX 17 GM/SCOOP powder SMARTSIG:1 Capful(s) By Mouth Daily Patient not taking: Reported on 10/14/2022 07/25/22   [provider]  docusate sodium (COLACE) 100 MG capsule Take 1 capsule (100 mg total) by mouth 2 (two) times daily. 10/15/22   Tyson Alias, NP  ibuprofen (ADVIL) 600 MG tablet Take 1 tablet (600 mg total) by  mouth every 6 (six) hours as needed. Take 1 tablet every 6 hours for 5 days regardless of pain, then as needed for pain and cramping. 10/15/22   Tyson Alias, NP  ibuprofen (ADVIL) 600 MG tablet Take 1 tablet (600 mg total) by mouth every 6 (six) hours as needed. 10/15/22   Tyson Alias, NP  LOMAIRA 8 MG TABS Take 1 tablet by mouth every morning. 09/19/22   [provider]  ondansetron (ZOFRAN) 8 MG tablet Take 1 tablet (8 mg total) by mouth every 8 (eight) hours as needed for nausea or vomiting. 10/15/22   Tyson Alias, NP  oxyCODONE-acetaminophen (PERCOCET) 5-325 MG tablet Take 1 tablet by mouth every 4 (four) hours as needed for severe pain. 10/15/22   Tyson Alias, NP  traZODone (DESYREL) 100 MG tablet Take 100 mg by mouth at bedtime.    [provider]  Vitamin D, Ergocalciferol, (DRISDOL) 1.25 MG (50000 UNIT) CAPS capsule Take 50,000 Units by mouth once a week. 10/09/22   [provider]     No family history on file.  Social History   Socioeconomic History   Marital status: Single    Spouse name: Not on file   Number of children: Not on file   Years of education: Not on file   Highest education level: Not on file  Occupational History   Not on file  Tobacco Use   Smoking status: Never   Smokeless tobacco: Not on file  Vaping Use   Vaping Use: Never used  Substance and Sexual Activity   Alcohol use: Yes    Alcohol/week: 1.0 standard drink of alcohol    Types: 1 Glasses of wine per week    Comment: occassional   Drug use: No   Sexual activity: Not on file  Other Topics Concern   Not on file  Social History Narrative   Not on file   Social Determinants of Health   Financial Resource Strain: Not on file  Food Insecurity: No Food Insecurity (10/14/2022)   Hunger Vital Sign    Worried About Running Out of Food in the Last Year: Never true    Ran Out of Food in the Last Year: Never true  Transportation Needs: No Transportation Needs  (10/14/2022)   PRAPARE - Hydrologist (Medical): No    Lack of Transportation (Non-Medical): No  Physical Activity: Not on file  Stress: Not on file  Social Connections: Not on file   Review of Systems  Review of Systems: A 12 point ROS discussed and pertinent positives are indicated in the HPI above.  All other systems are negative.  Physical Exam No direct physical exam was performed (except for noted visual exam findings with Video Visits).    Vital Signs: LMP 10/03/2022 (Exact Date)   Imaging: IR Angiogram Pelvis Selective Or Supraselective  Result Date: 10/14/2022 INDICATION: 46 year old woman with history of excessive bleeding and bladder pressure related to uterine fibroids presents to IR for urine artery embolization. EXAM: 1. Ultrasound-guided access of left radial artery. 2. Bilateral internal iliac arteriogram. 3. Bilateral uterine arteriogram and particle embolization. MEDICATIONS: Ancef 2 g IV. The antibiotic was administered within 1 hour of the procedure ANESTHESIA/SEDATION: Moderate (conscious) sedation was employed during this procedure. A total of Versed 2 mg and Fentanyl 100 mcg was administered intravenously by the radiology nurse. Total intra-service moderate Sedation Time: 65 minutes. The patient's level of consciousness and vital signs were monitored continuously by radiology nursing throughout the procedure under my direct supervision. CONTRAST:  60 mL of Omnipaque 300 intra-arterial FLUOROSCOPY: Radiation Exposure Index (as provided by the fluoroscopic device): 619 mGy Kerma COMPLICATIONS: None immediate. PROCEDURE: Informed consent was obtained from the patient following explanation of the procedure, risks, benefits and alternatives. The patient understands, agrees and consents for the procedure. All questions were addressed. A time out was performed prior to the initiation of the procedure. Maximal barrier sterile technique utilized including  caps, mask, sterile gowns, sterile gloves, large sterile drape, hand hygiene, and Betadine prep. Barbeau test of the left Radial artery showed Type B pattern with good collateral flow from the Ulnar artery. Patient positioned supine on the procedure table. Left wrist skin prepped and draped in usual fashion. Ultrasound image documenting patency and appropriate size of the left radial artery was obtained and placed in permanent medical record. Sterile ultrasound probe cover and gel utilized throughout the procedure. Utilizing continuous ultrasound guidance, the left radial artery was accessed at the level of the wrist with a 21 gauge needle. 21 gauge needle exchanged for a 5 French slim sheath over 0.018 inch guidewire. Combination of 25 mg of verapamil, 200 mcg of nitro, and 3000 units of heparin was mixed with 5 mL of the patient's blood and slowly injected through the radial sheath. [place] catheter and Glidewire were advanced through the left radial sheath to the level of the right internal iliac artery. Internal iliac artery angiogram was performed which showed patent uterine artery with perfusion of fibroids. The right  uterine artery was selected with Progreat microcatheter. Urine artery angiogram confirmed perfusion to uterine fibroids. Embolization was performed with 0.5 Vials of 500-700 micron embospheres. Administration of embospheres was continuously monitored with fluoroscopy. Post embolization angiogram shows significantly decreased flow to the uterine fibroids. Bern catheter was then utilized to select the left internal iliac artery. Left internal iliac angiogram showed patent left uterine artery with perfusion to uterine fibroids. Left uterine artery was selected with Cantata microcatheter. Embolization was performed with 0.5 Vials of 500-700 micron and 1 vial of 700-900 micron embospheres. Post embolization angiogram shows significantly decreased flow within the left uterine artery. The micro  and base catheter were removed. The left radial sheath was flushed without difficulty. The sheath was removed and TR band was applied. IMPRESSION: Bilateral uterine artery angiogram and embolization for treatment of uterine fibroids. Electronically Signed   By: Miachel Roux M.D.   On: 10/14/2022 14:22   IR Angiogram Selective Each Additional Vessel  Result Date: 10/14/2022 INDICATION: 46 year old woman with history of excessive bleeding and bladder pressure related to uterine fibroids presents to IR for urine artery embolization. EXAM: 1. Ultrasound-guided access of left radial artery. 2. Bilateral internal iliac arteriogram. 3. Bilateral uterine arteriogram and particle embolization. MEDICATIONS: Ancef 2 g IV. The antibiotic was administered within 1 hour of the procedure ANESTHESIA/SEDATION: Moderate (conscious) sedation was employed during this procedure. A total of Versed 2 mg and Fentanyl 100 mcg was administered intravenously by the radiology nurse. Total intra-service moderate Sedation Time: 65 minutes. The patient's level of consciousness and vital signs were monitored continuously by radiology nursing throughout the procedure under my direct supervision. CONTRAST:  60 mL of Omnipaque 300 intra-arterial FLUOROSCOPY: Radiation Exposure Index (as provided by the fluoroscopic device): 678 mGy Kerma COMPLICATIONS: None immediate. PROCEDURE: Informed consent was obtained from the patient following explanation of the procedure, risks, benefits and alternatives. The patient understands, agrees and consents for the procedure. All questions were addressed. A time out was performed prior to the initiation of the procedure. Maximal barrier sterile technique utilized including caps, mask, sterile gowns, sterile gloves, large sterile drape, hand hygiene, and Betadine prep. Barbeau test of the left Radial artery showed Type B pattern with good collateral flow from the Ulnar artery. Patient positioned supine on the  procedure table. Left wrist skin prepped and draped in usual fashion. Ultrasound image documenting patency and appropriate size of the left radial artery was obtained and placed in permanent medical record. Sterile ultrasound probe cover and gel utilized throughout the procedure. Utilizing continuous ultrasound guidance, the left radial artery was accessed at the level of the wrist with a 21 gauge needle. 21 gauge needle exchanged for a 5 French slim sheath over 0.018 inch guidewire. Combination of 25 mg of verapamil, 200 mcg of nitro, and 3000 units of heparin was mixed with 5 mL of the patient's blood and slowly injected through the radial sheath. [place] catheter and Glidewire were advanced through the left radial sheath to the level of the right internal iliac artery. Internal iliac artery angiogram was performed which showed patent uterine artery with perfusion of fibroids. The right uterine artery was selected with Progreat microcatheter. Urine artery angiogram confirmed perfusion to uterine fibroids. Embolization was performed with 0.5 Vials of 500-700 micron embospheres. Administration of embospheres was continuously monitored with fluoroscopy. Post embolization angiogram shows significantly decreased flow to the uterine fibroids. Bern catheter was then utilized to select the left internal iliac artery. Left internal iliac angiogram showed patent left  uterine artery with perfusion to uterine fibroids. Left uterine artery was selected with Cantata microcatheter. Embolization was performed with 0.5 Vials of 500-700 micron and 1 vial of 700-900 micron embospheres. Post embolization angiogram shows significantly decreased flow within the left uterine artery. The micro and base catheter were removed. The left radial sheath was flushed without difficulty. The sheath was removed and TR band was applied. IMPRESSION: Bilateral uterine artery angiogram and embolization for treatment of uterine fibroids.  Electronically Signed   By: Miachel Roux M.D.   On: 10/14/2022 14:22   IR Angiogram Selective Each Additional Vessel  Result Date: 10/14/2022 INDICATION: 46 year old woman with history of excessive bleeding and bladder pressure related to uterine fibroids presents to IR for urine artery embolization. EXAM: 1. Ultrasound-guided access of left radial artery. 2. Bilateral internal iliac arteriogram. 3. Bilateral uterine arteriogram and particle embolization. MEDICATIONS: Ancef 2 g IV. The antibiotic was administered within 1 hour of the procedure ANESTHESIA/SEDATION: Moderate (conscious) sedation was employed during this procedure. A total of Versed 2 mg and Fentanyl 100 mcg was administered intravenously by the radiology nurse. Total intra-service moderate Sedation Time: 65 minutes. The patient's level of consciousness and vital signs were monitored continuously by radiology nursing throughout the procedure under my direct supervision. CONTRAST:  60 mL of Omnipaque 300 intra-arterial FLUOROSCOPY: Radiation Exposure Index (as provided by the fluoroscopic device): 329 mGy Kerma COMPLICATIONS: None immediate. PROCEDURE: Informed consent was obtained from the patient following explanation of the procedure, risks, benefits and alternatives. The patient understands, agrees and consents for the procedure. All questions were addressed. A time out was performed prior to the initiation of the procedure. Maximal barrier sterile technique utilized including caps, mask, sterile gowns, sterile gloves, large sterile drape, hand hygiene, and Betadine prep. Barbeau test of the left Radial artery showed Type B pattern with good collateral flow from the Ulnar artery. Patient positioned supine on the procedure table. Left wrist skin prepped and draped in usual fashion. Ultrasound image documenting patency and appropriate size of the left radial artery was obtained and placed in permanent medical record. Sterile ultrasound probe cover  and gel utilized throughout the procedure. Utilizing continuous ultrasound guidance, the left radial artery was accessed at the level of the wrist with a 21 gauge needle. 21 gauge needle exchanged for a 5 French slim sheath over 0.018 inch guidewire. Combination of 25 mg of verapamil, 200 mcg of nitro, and 3000 units of heparin was mixed with 5 mL of the patient's blood and slowly injected through the radial sheath. [place] catheter and Glidewire were advanced through the left radial sheath to the level of the right internal iliac artery. Internal iliac artery angiogram was performed which showed patent uterine artery with perfusion of fibroids. The right uterine artery was selected with Progreat microcatheter. Urine artery angiogram confirmed perfusion to uterine fibroids. Embolization was performed with 0.5 Vials of 500-700 micron embospheres. Administration of embospheres was continuously monitored with fluoroscopy. Post embolization angiogram shows significantly decreased flow to the uterine fibroids. Bern catheter was then utilized to select the left internal iliac artery. Left internal iliac angiogram showed patent left uterine artery with perfusion to uterine fibroids. Left uterine artery was selected with Cantata microcatheter. Embolization was performed with 0.5 Vials of 500-700 micron and 1 vial of 700-900 micron embospheres. Post embolization angiogram shows significantly decreased flow within the left uterine artery. The micro and base catheter were removed. The left radial sheath was flushed without difficulty. The sheath was removed and  TR band was applied. IMPRESSION: Bilateral uterine artery angiogram and embolization for treatment of uterine fibroids. Electronically Signed   By: Miachel Roux M.D.   On: 10/14/2022 14:22   IR US Guide Vasc Access Left  Result Date: 10/14/2022 INDICATION: 46 year old woman with history of excessive bleeding and bladder pressure related to uterine fibroids  presents to IR for urine artery embolization. EXAM: 1. Ultrasound-guided access of left radial artery. 2. Bilateral internal iliac arteriogram. 3. Bilateral uterine arteriogram and particle embolization. MEDICATIONS: Ancef 2 g IV. The antibiotic was administered within 1 hour of the procedure ANESTHESIA/SEDATION: Moderate (conscious) sedation was employed during this procedure. A total of Versed 2 mg and Fentanyl 100 mcg was administered intravenously by the radiology nurse. Total intra-service moderate Sedation Time: 65 minutes. The patient's level of consciousness and vital signs were monitored continuously by radiology nursing throughout the procedure under my direct supervision. CONTRAST:  60 mL of Omnipaque 300 intra-arterial FLUOROSCOPY: Radiation Exposure Index (as provided by the fluoroscopic device): 263 mGy Kerma COMPLICATIONS: None immediate. PROCEDURE: Informed consent was obtained from the patient following explanation of the procedure, risks, benefits and alternatives. The patient understands, agrees and consents for the procedure. All questions were addressed. A time out was performed prior to the initiation of the procedure. Maximal barrier sterile technique utilized including caps, mask, sterile gowns, sterile gloves, large sterile drape, hand hygiene, and Betadine prep. Barbeau test of the left Radial artery showed Type B pattern with good collateral flow from the Ulnar artery. Patient positioned supine on the procedure table. Left wrist skin prepped and draped in usual fashion. Ultrasound image documenting patency and appropriate size of the left radial artery was obtained and placed in permanent medical record. Sterile ultrasound probe cover and gel utilized throughout the procedure. Utilizing continuous ultrasound guidance, the left radial artery was accessed at the level of the wrist with a 21 gauge needle. 21 gauge needle exchanged for a 5 French slim sheath over 0.018 inch guidewire.  Combination of 25 mg of verapamil, 200 mcg of nitro, and 3000 units of heparin was mixed with 5 mL of the patient's blood and slowly injected through the radial sheath. [place] catheter and Glidewire were advanced through the left radial sheath to the level of the right internal iliac artery. Internal iliac artery angiogram was performed which showed patent uterine artery with perfusion of fibroids. The right uterine artery was selected with Progreat microcatheter. Urine artery angiogram confirmed perfusion to uterine fibroids. Embolization was performed with 0.5 Vials of 500-700 micron embospheres. Administration of embospheres was continuously monitored with fluoroscopy. Post embolization angiogram shows significantly decreased flow to the uterine fibroids. Bern catheter was then utilized to select the left internal iliac artery. Left internal iliac angiogram showed patent left uterine artery with perfusion to uterine fibroids. Left uterine artery was selected with Cantata microcatheter. Embolization was performed with 0.5 Vials of 500-700 micron and 1 vial of 700-900 micron embospheres. Post embolization angiogram shows significantly decreased flow within the left uterine artery. The micro and base catheter were removed. The left radial sheath was flushed without difficulty. The sheath was removed and TR band was applied. IMPRESSION: Bilateral uterine artery angiogram and embolization for treatment of uterine fibroids. Electronically Signed   By: Miachel Roux M.D.   On: 10/14/2022 14:21   IR EMBO TUMOR ORGAN ISCHEMIA INFARCT INC GUIDE ROADMAPPING  Result Date: 10/14/2022 INDICATION: 46 year old woman with history of excessive bleeding and bladder pressure related to uterine fibroids presents to IR  for urine artery embolization. EXAM: 1. Ultrasound-guided access of left radial artery. 2. Bilateral internal iliac arteriogram. 3. Bilateral uterine arteriogram and particle embolization. MEDICATIONS: Ancef 2  g IV. The antibiotic was administered within 1 hour of the procedure ANESTHESIA/SEDATION: Moderate (conscious) sedation was employed during this procedure. A total of Versed 2 mg and Fentanyl 100 mcg was administered intravenously by the radiology nurse. Total intra-service moderate Sedation Time: 65 minutes. The patient's level of consciousness and vital signs were monitored continuously by radiology nursing throughout the procedure under my direct supervision. CONTRAST:  60 mL of Omnipaque 300 intra-arterial FLUOROSCOPY: Radiation Exposure Index (as provided by the fluoroscopic device): 607 mGy Kerma COMPLICATIONS: None immediate. PROCEDURE: Informed consent was obtained from the patient following explanation of the procedure, risks, benefits and alternatives. The patient understands, agrees and consents for the procedure. All questions were addressed. A time out was performed prior to the initiation of the procedure. Maximal barrier sterile technique utilized including caps, mask, sterile gowns, sterile gloves, large sterile drape, hand hygiene, and Betadine prep. Barbeau test of the left Radial artery showed Type B pattern with good collateral flow from the Ulnar artery. Patient positioned supine on the procedure table. Left wrist skin prepped and draped in usual fashion. Ultrasound image documenting patency and appropriate size of the left radial artery was obtained and placed in permanent medical record. Sterile ultrasound probe cover and gel utilized throughout the procedure. Utilizing continuous ultrasound guidance, the left radial artery was accessed at the level of the wrist with a 21 gauge needle. 21 gauge needle exchanged for a 5 French slim sheath over 0.018 inch guidewire. Combination of 25 mg of verapamil, 200 mcg of nitro, and 3000 units of heparin was mixed with 5 mL of the patient's blood and slowly injected through the radial sheath. [place] catheter and Glidewire were advanced through the  left radial sheath to the level of the right internal iliac artery. Internal iliac artery angiogram was performed which showed patent uterine artery with perfusion of fibroids. The right uterine artery was selected with Progreat microcatheter. Urine artery angiogram confirmed perfusion to uterine fibroids. Embolization was performed with 0.5 Vials of 500-700 micron embospheres. Administration of embospheres was continuously monitored with fluoroscopy. Post embolization angiogram shows significantly decreased flow to the uterine fibroids. Bern catheter was then utilized to select the left internal iliac artery. Left internal iliac angiogram showed patent left uterine artery with perfusion to uterine fibroids. Left uterine artery was selected with Cantata microcatheter. Embolization was performed with 0.5 Vials of 500-700 micron and 1 vial of 700-900 micron embospheres. Post embolization angiogram shows significantly decreased flow within the left uterine artery. The micro and base catheter were removed. The left radial sheath was flushed without difficulty. The sheath was removed and TR band was applied. IMPRESSION: Bilateral uterine artery angiogram and embolization for treatment of uterine fibroids. Electronically Signed   By: Miachel Roux M.D.   On: 10/14/2022 13:09    Labs:  CBC: Recent Labs    10/14/22 0800  WBC 3.1*  HGB 12.0  HCT 36.0  PLT 270    COAGS: Recent Labs    10/14/22 0800  INR 1.1    BMP: Recent Labs    10/14/22 0800  NA 140  K 3.7  CL 106  CO2 27  GLUCOSE 96  BUN 11  CALCIUM 8.9  CREATININE 0.81  GFRNONAA >60    LIVER FUNCTION TESTS: No results for input(s): "BILITOT", "AST", "ALT", "ALKPHOS", "PROT", "ALBUMIN"  in the last 8760 hours.  TUMOR MARKERS: No results for input(s): "AFPTM", "CEA", "CA199", "CHROMGRNA" in the last 8760 hours.  Assessment and Plan:  46 year old woman with history of uterine fibroids causing excessive bleeding and bladder pressure  status post bilateral uterine artery embolization on 10/14/2022.  She is doing well.  She has had hot flashes since the procedure, which have improved since utilizing over-the-counter magnesium supplementation.  These symptoms should be transient and resolve in the coming weeks.  If they do not resolve, I asked her to follow-up with her PCP and/or gynecologist regarding additional treatments.    Her symptoms of bleeding and bladder pressure should continue to improve in the coming months.  I will follow up with her again 5 months.   Plan:  - Clinic tele-visit in May 2024  Thank you for this interesting consult.  I greatly enjoyed meeting Stephanie Ayers and look forward to participating in their care.  A copy of this report was sent to the requesting provider on this date.  Electronically Signed: Paula Libra Torrey Horseman 11/07/2022, 12:31 PM   I spent a total of    15 Minutes in remote  clinical consultation, greater than 50% of which was counseling/coordinating care for Uterine Fibroid Embolization.    Visit type: Audio only (telephone). Audio (no video) only due to lack of technical resources. Alternative for in-person consultation at [facility], [place], [place], [place].  his format is felt to be most appropriate for this patient at this time.  All issues noted in this document were discussed and addressed.

## 2023-03-31 ENCOUNTER — Other Ambulatory Visit: Payer: Self-pay | Admitting: Interventional Radiology

## 2023-03-31 DIAGNOSIS — D250 Submucous leiomyoma of uterus: Secondary | ICD-10-CM

## 2023-04-16 ENCOUNTER — Ambulatory Visit
Admission: RE | Admit: 2023-04-16 | Discharge: 2023-04-16 | Disposition: A | Discharge status: Home / Self Care | Payer: Federal, State, Local not specified - PPO | Source: Ambulatory Visit | Attending: Interventional Radiology | Admitting: Interventional Radiology

## 2023-04-16 DIAGNOSIS — D250 Submucous leiomyoma of uterus: Secondary | ICD-10-CM

## 2023-04-16 HISTORY — PX: IR RADIOLOGIST EVAL & MGMT: IMG5224

## 2023-04-16 NOTE — Progress Notes (Signed)
Chief Complaint: Uterine Fibroids    History of Present Illness: Stephanie Ayers is a 47 y.o. female with history of Uterine fibroids causing excessive bleeding and bladder pressure and dyspareunia. She underwent uterine artery embolization on 10/14/2022 and presents to IR clinic for follow-up.    She feels well.  Her bleeding has improved since the procedure and she is pleased with the results.  She continues to experience bladder pressure.  She denies any new symptoms.   Past Medical History:  Diagnosis Date   Asthma     Past Surgical History:  Procedure Laterality Date   IR ANGIOGRAM PELVIS SELECTIVE OR SUPRASELECTIVE  10/14/2022   IR ANGIOGRAM SELECTIVE EACH ADDITIONAL VESSEL  10/14/2022   IR ANGIOGRAM SELECTIVE EACH ADDITIONAL VESSEL  10/14/2022   IR EMBO TUMOR ORGAN ISCHEMIA INFARCT INC GUIDE ROADMAPPING  10/14/2022   IR RADIOLOGIST EVAL & MGMT  06/05/2021   IR RADIOLOGIST EVAL & MGMT  07/18/2022   IR RADIOLOGIST EVAL & MGMT  11/07/2022   IR US GUIDE VASC ACCESS LEFT  10/14/2022    Allergies: Shellfish allergy  Medications: Prior to Admission medications   Medication Sig Start Date End Date Taking? Authorizing Provider  albuterol (VENTOLIN HFA) 108 (90 Base) MCG/ACT inhaler PLEASE SEE ATTACHED FOR DETAILED DIRECTIONS    [provider]  ASHWAGANDHA PO Take 1 tablet by mouth as needed.    [provider]  Cranberry-Vitamin C-Probiotic (AZO CRANBERRY PO) Take 2 tablets by mouth once. Patient not taking: Reported on 10/14/2022    [provider]  [employer] PURELAX 17 GM/SCOOP powder SMARTSIG:1 Capful(s) By Mouth Daily Patient not taking: Reported on 10/14/2022 07/25/22   [provider]  docusate sodium (COLACE) 100 MG capsule Take 1 capsule (100 mg total) by mouth 2 (two) times daily. 10/15/22   Shon Hough, NP  ibuprofen (ADVIL) 600 MG tablet Take 1 tablet (600 mg total) by mouth every 6 (six) hours as needed. Take 1 tablet every 6 hours for 5  days regardless of pain, then as needed for pain and cramping. 10/15/22   Shon Hough, NP  ibuprofen (ADVIL) 600 MG tablet Take 1 tablet (600 mg total) by mouth every 6 (six) hours as needed. 10/15/22   Shon Hough, NP  LOMAIRA 8 MG TABS Take 1 tablet by mouth every morning. 09/19/22   [provider]  ondansetron (ZOFRAN) 8 MG tablet Take 1 tablet (8 mg total) by mouth every 8 (eight) hours as needed for nausea or vomiting. 10/15/22   Shon Hough, NP  oxyCODONE-acetaminophen (PERCOCET) 5-325 MG tablet Take 1 tablet by mouth every 4 (four) hours as needed for severe pain. 10/15/22   Shon Hough, NP  traZODone (DESYREL) 100 MG tablet Take 100 mg by mouth at bedtime.    [provider]  Vitamin D, Ergocalciferol, (DRISDOL) 1.25 MG (50000 UNIT) CAPS capsule Take 50,000 Units by mouth once a week. 10/09/22   [provider]     No family history on file.  Social History   Socioeconomic History   Marital status: Single    Spouse name: Not on file   Number of children: Not on file   Years of education: Not on file   Highest education level: Not on file  Occupational History   Not on file  Tobacco Use   Smoking status: Never   Smokeless tobacco: Not on file  Vaping Use   Vaping Use: Never used  Substance and Sexual Activity  Alcohol use: Yes    Alcohol/week: 1.0 standard drink of alcohol    Types: 1 Glasses of wine per week    Comment: occassional   Drug use: No   Sexual activity: Not on file  Other Topics Concern   Not on file  Social History Narrative   Not on file   Social Determinants of Health   Financial Resource Strain: Not on file  Food Insecurity: No Food Insecurity (10/14/2022)   Hunger Vital Sign    Worried About Running Out of Food in the Last Year: Never true    Ran Out of Food in the Last Year: Never true  Transportation Needs: No Transportation Needs (10/14/2022)   PRAPARE - Administrator, Civil Service (Medical): No     Lack of Transportation (Non-Medical): No  Physical Activity: Not on file  Stress: Not on file  Social Connections: Not on file   Review of Systems  Review of Systems: A 12 point ROS discussed and pertinent positives are indicated in the HPI above.  All other systems are negative.  Physical Exam No direct physical exam was performed (except for noted visual exam findings with Video Visits).   Vital Signs: There were no vitals taken for this visit.  Imaging: No results found.  Labs:  CBC: Recent Labs    10/14/22 0800  WBC 3.1*  HGB 12.0  HCT 36.0  PLT 270    COAGS: Recent Labs    10/14/22 0800  INR 1.1    BMP: Recent Labs    10/14/22 0800  NA 140  K 3.7  CL 106  CO2 27  GLUCOSE 96  BUN 11  CALCIUM 8.9  CREATININE 0.81  GFRNONAA >60    LIVER FUNCTION TESTS: No results for input(s): "BILITOT", "AST", "ALT", "ALKPHOS", "PROT", "ALBUMIN" in the last 8760 hours.  TUMOR MARKERS: No results for input(s): "AFPTM", "CEA", "CA199", "CHROMGRNA" in the last 8760 hours.  Assessment and Plan:  47 year old woman with history of uterine fibroids causing excessive bleeding and bladder pressure status post bilateral uterine artery embolization on 10/14/2022.  She is doing well.  Her bleeding symptoms have improved.  She has continued bladder pressure.  I explained to her that bulk symptoms take the longest to improve and she should see improvement over the next six months.    All her questions and concerns were answered.  She does not need any further follow up with IR clinic.  I asked her to contact my office if she has any questions.  Thank you for this interesting consult.  I greatly enjoyed meeting Stephanie Ayers and look forward to participating in their care.  A copy of this report was sent to the requesting provider on this date.  Electronically Signed: Al Corpus Metztli Sachdev 04/16/2023, 8:31 AM   I spent a total of    15 Minutes in remote  clinical consultation,  greater than 50% of which was counseling/coordinating care for Uterine Fibroid Embolization.    Visit type: Audio only (telephone). Audio (no video) only due to technical limitations. Alternative for in-person consultation at [facility], [address] [place], [place], [place]. This visit type was conducted due to national recommendations for restrictions regarding the COVID-19 Pandemic (e.g. social distancing).  This format is felt to be most appropriate for this patient at this time.  All issues noted in this document were discussed and addressed.
